# Patient Record
Sex: Female | Born: 2000 | Race: White | Hispanic: Yes | Marital: Single | Smoking: Never smoker
Health system: Southern US, Community
[De-identification: ages and names within clinical notes are randomized; demographics above are authoritative.]

## PROBLEM LIST (undated history)

## (undated) DIAGNOSIS — J45909 Unspecified asthma, uncomplicated: Secondary | ICD-10-CM

## (undated) DIAGNOSIS — F909 Attention-deficit hyperactivity disorder, unspecified type: Secondary | ICD-10-CM

## (undated) DIAGNOSIS — G43909 Migraine, unspecified, not intractable, without status migrainosus: Secondary | ICD-10-CM

## (undated) DIAGNOSIS — K5900 Constipation, unspecified: Secondary | ICD-10-CM

## (undated) DIAGNOSIS — N926 Irregular menstruation, unspecified: Principal | ICD-10-CM

## (undated) HISTORY — DX: Attention-deficit hyperactivity disorder, unspecified type: F90.9

## (undated) HISTORY — DX: Irregular menstruation, unspecified: N92.6

## (undated) HISTORY — DX: Unspecified asthma, uncomplicated: J45.909

## (undated) HISTORY — DX: Constipation, unspecified: K59.00

## (undated) HISTORY — PX: WISDOM TOOTH EXTRACTION: SHX21

## (undated) HISTORY — DX: Migraine, unspecified, not intractable, without status migrainosus: G43.909

---
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
## 2018-01-11 ENCOUNTER — Ambulatory Visit: Payer: BC Managed Care – PPO | Admitting: Adult Health

## 2018-02-13 ENCOUNTER — Telehealth: Payer: Self-pay | Admitting: Women's Health

## 2018-02-13 NOTE — Telephone Encounter (Signed)
Pt wanted to be worked in this week for bleeding and a rash from the panty liners she is having to wear all the time, I didn't have anything so she wanted to have someone call her and maybe advise something she can try or let us know if we need to work in somewhere this week thanks

## 2018-02-13 NOTE — Telephone Encounter (Signed)
Patient states she has a rash in her vaginal area due to having to wear panty liners for her bleeding.  She has used Kotex and hasn't had a problem in the past.  Advised patient to try switching brands and to also try using some hydrocortisone cream to the area.  Informed she may need something for yeast as well and could check at the pharmacy.  Verbalized understanding.

## 2018-03-03 ENCOUNTER — Encounter: Payer: Self-pay | Admitting: Family Medicine

## 2018-04-05 ENCOUNTER — Ambulatory Visit: Payer: BC Managed Care – PPO | Admitting: Women's Health

## 2018-04-05 ENCOUNTER — Ambulatory Visit (INDEPENDENT_AMBULATORY_CARE_PROVIDER_SITE_OTHER): Payer: BC Managed Care – PPO | Admitting: *Deleted

## 2018-04-05 ENCOUNTER — Encounter: Payer: Self-pay | Admitting: Women's Health

## 2018-04-05 ENCOUNTER — Encounter: Payer: Self-pay | Admitting: *Deleted

## 2018-04-05 VITALS — BP 109/61 | HR 84 | Ht 62.0 in | Wt 141.0 lb

## 2018-04-05 DIAGNOSIS — Z308 Encounter for other contraceptive management: Secondary | ICD-10-CM

## 2018-04-05 DIAGNOSIS — Z3201 Encounter for pregnancy test, result positive: Secondary | ICD-10-CM

## 2018-04-05 DIAGNOSIS — Z3202 Encounter for pregnancy test, result negative: Secondary | ICD-10-CM

## 2018-04-05 DIAGNOSIS — Z3042 Encounter for surveillance of injectable contraceptive: Secondary | ICD-10-CM | POA: Diagnosis not present

## 2018-04-05 DIAGNOSIS — Z3046 Encounter for surveillance of implantable subdermal contraceptive: Secondary | ICD-10-CM

## 2018-04-05 LAB — POCT URINE PREGNANCY: PREG TEST UR: NEGATIVE

## 2018-04-05 MED ORDER — MEDROXYPROGESTERONE ACETATE 150 MG/ML IM SUSP
150.0000 mg | Freq: Once | INTRAMUSCULAR | Status: AC
  Administered 2018-04-05: 150 mg via INTRAMUSCULAR

## 2018-04-05 MED ORDER — MEDROXYPROGESTERONE ACETATE 150 MG/ML IM SUSP: 150.0000 mg | INTRAMUSCULAR | 3 refills | Status: DC

## 2018-04-05 NOTE — Patient Instructions (Signed)

## 2018-04-05 NOTE — Progress Notes (Signed)
Pt here for Depo. Pt received Depo in left deltoid. Pt tolerated shot well. Return in 12 weeks for next shot. JSY 

## 2018-04-05 NOTE — Progress Notes (Signed)
   [facility] Patient name: Tonya Mclaughlin MRN [id number]  Date of birth: 11/22/2000 Subjective Findings:   Tonya Mclaughlin is a 17 y.o. G0P0000 Caucasian female being seen today for removal of a Nexplanon. Her Nexplanon was placed 11/29/17.  She desires removal because irregular/unpredictable bleeding. Signed copy of informed consent in chart.   No LMP recorded. Patient has had an implant. Last pap <21yo. Results were:  n/a The planned method of family planning is Depo-Provera injections Pertinent History Reviewed:   Reviewed past medical,surgical, social, obstetrical and family history.  Reviewed problem list, medications and allergies. Objective Findings & Procedure:    Vitals:   04/05/18 1120  BP: 109/61  Pulse: 84  Weight: 141 lb (64 kg)  Height: 5\' 2"  (1.575 m)  Body mass index is 25.79 kg/m.  No results found for this or any previous visit (from the past 24 hour(s)).   Time out was performed.  Nexplanon site identified.  Area prepped in usual sterile fashon. One cc of 2% lidocaine was used to anesthetize the area at the distal end of the implant. A small stab incision was made right beside the implant on the distal portion.  The Nexplanon rod was grasped using hemostats and removed without difficulty.  There was less than 3 cc blood loss. There were no complications.  Steri-strips were applied over the small incision and a pressure bandage was applied.  The patient tolerated the procedure well. Assessment & Plan:   1) Nexplanon removal She was instructed to keep the area clean and dry, remove pressure bandage in 24 hours, and keep insertion site covered with the steri-strip for 3-5 days.   Follow-up PRN problems.  No orders of the defined types were placed in this encounter.   Follow-up: Return for today for depo. condoms x 2wks  Cheral MarkerKimberly R Anush Wiedeman CNM, [facility]NP-BC 04/05/2018 12:11 PM

## 2018-06-28 ENCOUNTER — Encounter: Payer: Self-pay | Admitting: *Deleted

## 2018-06-28 ENCOUNTER — Ambulatory Visit (INDEPENDENT_AMBULATORY_CARE_PROVIDER_SITE_OTHER): Payer: BC Managed Care – PPO | Admitting: *Deleted

## 2018-06-28 DIAGNOSIS — Z3042 Encounter for surveillance of injectable contraceptive: Secondary | ICD-10-CM

## 2018-06-28 DIAGNOSIS — Z3202 Encounter for pregnancy test, result negative: Secondary | ICD-10-CM

## 2018-06-28 DIAGNOSIS — Z308 Encounter for other contraceptive management: Secondary | ICD-10-CM

## 2018-06-28 LAB — POCT URINE PREGNANCY: PREG TEST UR: NEGATIVE

## 2018-06-28 MED ORDER — MEDROXYPROGESTERONE ACETATE 150 MG/ML IM SUSP
150.0000 mg | Freq: Once | INTRAMUSCULAR | Status: AC
  Administered 2018-06-28: 150 mg via INTRAMUSCULAR

## 2018-06-28 NOTE — Progress Notes (Signed)
Pt here for Depo. Pt received shot in right deltoid. Pt tolerated shot well. Return in 12 weeks for next shot. JSY 

## 2018-09-21 ENCOUNTER — Encounter: Payer: Self-pay | Admitting: *Deleted

## 2018-09-21 ENCOUNTER — Encounter (INDEPENDENT_AMBULATORY_CARE_PROVIDER_SITE_OTHER): Payer: Self-pay

## 2018-09-21 ENCOUNTER — Ambulatory Visit (INDEPENDENT_AMBULATORY_CARE_PROVIDER_SITE_OTHER): Payer: BC Managed Care – PPO | Admitting: *Deleted

## 2018-09-21 VITALS — Wt 147.0 lb

## 2018-09-21 DIAGNOSIS — Z3042 Encounter for surveillance of injectable contraceptive: Secondary | ICD-10-CM

## 2018-09-21 DIAGNOSIS — Z3202 Encounter for pregnancy test, result negative: Secondary | ICD-10-CM | POA: Diagnosis not present

## 2018-09-21 LAB — POCT URINE PREGNANCY: PREG TEST UR: NEGATIVE

## 2018-09-21 MED ORDER — MEDROXYPROGESTERONE ACETATE 150 MG/ML IM SUSP
150.0000 mg | Freq: Once | INTRAMUSCULAR | Status: AC
  Administered 2018-09-21: 150 mg via INTRAMUSCULAR

## 2018-09-21 NOTE — Progress Notes (Signed)
Depo Provera 150mg IM given in left deltoid with no complications. Pt to return in 12 weeks for next injection.  

## 2018-12-14 ENCOUNTER — Encounter: Payer: Self-pay | Admitting: *Deleted

## 2018-12-14 ENCOUNTER — Other Ambulatory Visit: Payer: Self-pay

## 2018-12-14 ENCOUNTER — Ambulatory Visit (INDEPENDENT_AMBULATORY_CARE_PROVIDER_SITE_OTHER): Payer: BC Managed Care – PPO | Admitting: *Deleted

## 2018-12-14 DIAGNOSIS — Z3042 Encounter for surveillance of injectable contraceptive: Secondary | ICD-10-CM | POA: Diagnosis not present

## 2018-12-14 DIAGNOSIS — Z308 Encounter for other contraceptive management: Secondary | ICD-10-CM

## 2018-12-14 DIAGNOSIS — Z3202 Encounter for pregnancy test, result negative: Secondary | ICD-10-CM

## 2018-12-14 LAB — POCT URINE PREGNANCY: Preg Test, Ur: NEGATIVE

## 2018-12-14 MED ORDER — MEDROXYPROGESTERONE ACETATE 150 MG/ML IM SUSP
150.0000 mg | Freq: Once | INTRAMUSCULAR | Status: AC
  Administered 2018-12-14: 150 mg via INTRAMUSCULAR

## 2018-12-14 NOTE — Progress Notes (Signed)
Pt here for Depo. Pt received shot in right deltoid. Pt tolerated shot well. Return in 12 weeks for next shot. JSY 

## 2019-03-08 ENCOUNTER — Other Ambulatory Visit: Payer: Self-pay | Admitting: Women's Health

## 2019-03-08 ENCOUNTER — Ambulatory Visit: Payer: BC Managed Care – PPO

## 2019-03-09 ENCOUNTER — Other Ambulatory Visit: Payer: Self-pay

## 2019-03-09 ENCOUNTER — Ambulatory Visit (INDEPENDENT_AMBULATORY_CARE_PROVIDER_SITE_OTHER): Payer: BC Managed Care – PPO | Admitting: *Deleted

## 2019-03-09 DIAGNOSIS — Z3042 Encounter for surveillance of injectable contraceptive: Secondary | ICD-10-CM | POA: Diagnosis not present

## 2019-03-09 MED ORDER — MEDROXYPROGESTERONE ACETATE 150 MG/ML IM SUSP
150.0000 mg | Freq: Once | INTRAMUSCULAR | Status: AC
  Administered 2019-03-09: 150 mg via INTRAMUSCULAR

## 2019-03-09 NOTE — Progress Notes (Signed)
Pt in for depo provera injection. Given IM in left deltoid. Patient tolerated well. Next dose 8/28-9/11.

## 2019-03-14 ENCOUNTER — Encounter: Payer: Self-pay | Admitting: Gastroenterology

## 2019-03-27 ENCOUNTER — Telehealth: Payer: Self-pay | Admitting: Gastroenterology

## 2019-03-27 NOTE — Telephone Encounter (Signed)
Patient called and said that she could not come in for her Wednesday appointment because of work and wanted to come in Thursday, I told her that we would have to reschedule because we did not have anything this Thursday and she said that she waited a month to get this appointment so she will just go somewhere else.

## 2019-03-27 NOTE — Telephone Encounter (Signed)
Noted  

## 2019-03-28 ENCOUNTER — Ambulatory Visit: Payer: BC Managed Care – PPO | Admitting: Gastroenterology

## 2019-04-13 ENCOUNTER — Other Ambulatory Visit: Payer: Self-pay | Admitting: Gastroenterology

## 2019-04-13 DIAGNOSIS — R1011 Right upper quadrant pain: Secondary | ICD-10-CM

## 2019-04-24 ENCOUNTER — Ambulatory Visit
Admission: RE | Admit: 2019-04-24 | Discharge: 2019-04-24 | Disposition: A | Discharge status: Home / Self Care | Payer: BC Managed Care – PPO | Source: Ambulatory Visit | Attending: Gastroenterology | Admitting: Gastroenterology

## 2019-04-24 ENCOUNTER — Other Ambulatory Visit: Payer: Self-pay

## 2019-04-24 DIAGNOSIS — R1011 Right upper quadrant pain: Secondary | ICD-10-CM | POA: Insufficient documentation

## 2019-04-24 MED ORDER — TECHNETIUM TC 99M MEBROFENIN IV KIT
5.0000 | PACK | Freq: Once | INTRAVENOUS | Status: AC | PRN
  Administered 2019-04-24: 5.42 via INTRAVENOUS

## 2019-06-01 ENCOUNTER — Ambulatory Visit: Payer: BC Managed Care – PPO

## 2019-06-01 ENCOUNTER — Telehealth: Payer: Self-pay | Admitting: Obstetrics & Gynecology

## 2019-06-01 NOTE — Telephone Encounter (Signed)
Called patient regarding appointment and the following message was left: ° ° °We have you scheduled for an upcoming appointment at our office. At this time, patients are encouraged to come alone to their visits whenever possible, however, a support person, over age 20, may accompany you to your appointment if assistance is needed for safety or care concerns. Otherwise, support persons should remain outside until the visit is complete.  ° °We ask if you have had any exposure to anyone suspected or confirmed of having COVID-19 or if you are experiencing any of the following, to call and reschedule your appointment: fever, cough, shortness of breath, muscle pain, diarrhea, rash, vomiting, abdominal pain, red eye, weakness, bruising, bleeding, joint pain, or a severe headache.  ° °Please know we will ask you these questions or similar questions when you arrive for your appointment and again it’s how we are keeping everyone safe.   ° °Also,to keep you safe, please use the provided hand sanitizer when you enter the office. We are asking everyone in the office to wear a mask to help prevent the spread of °germs. If you have a mask of your own, please wear it to your appointment, if not, we are happy to provide one for you. ° °Thank you for understanding and your cooperation.  ° ° °[facility] Staff ° ° ° ° ° °

## 2019-06-05 ENCOUNTER — Ambulatory Visit: Payer: BC Managed Care – PPO

## 2019-06-05 ENCOUNTER — Other Ambulatory Visit: Payer: Self-pay | Admitting: Surgery

## 2019-06-05 ENCOUNTER — Telehealth: Payer: Self-pay | Admitting: *Deleted

## 2019-06-05 NOTE — Telephone Encounter (Signed)
Depo called into [facility] on [address]. Left message letting pt know and also that she needs an appt before any further refills. [name]

## 2019-06-06 ENCOUNTER — Telehealth: Payer: Self-pay | Admitting: Obstetrics & Gynecology

## 2019-06-06 NOTE — Telephone Encounter (Signed)
Called patient regarding appointment scheduled in our office encouraged to come alone to the visit if possible, however, a support person, over age 20, may accompany her  to appointment if assistance is needed for safety or care concerns. Otherwise, support persons should remain outside until the visit is complete.  ° °We ask if you have had any exposure to anyone suspected or confirmed of having COVID-19 or if you are experiencing any of the following, to call and reschedule your appointment: fever, cough, shortness of breath, muscle pain, diarrhea, rash, vomiting, abdominal pain, red eye, weakness, bruising, bleeding, joint pain, or a severe headache.  ° °Please know we will ask you these questions or similar questions when you arrive for your appointment and again it’s how we are keeping everyone safe.   ° °Also,to keep you safe, please use the provided hand sanitizer when you enter the office. We are asking everyone in the office to wear a mask to help prevent the spread of °germs. If you have a mask of your own, please wear it to your appointment, if not, we are happy to provide one for you. ° °Thank you for understanding and your cooperation.  ° ° °[facility] Staff ° ° ° °

## 2019-06-07 ENCOUNTER — Other Ambulatory Visit: Payer: Self-pay

## 2019-06-07 ENCOUNTER — Ambulatory Visit (INDEPENDENT_AMBULATORY_CARE_PROVIDER_SITE_OTHER): Payer: BC Managed Care – PPO

## 2019-06-07 VITALS — Ht 62.0 in | Wt 154.0 lb

## 2019-06-07 DIAGNOSIS — Z3042 Encounter for surveillance of injectable contraceptive: Secondary | ICD-10-CM | POA: Diagnosis not present

## 2019-06-07 DIAGNOSIS — Z3202 Encounter for pregnancy test, result negative: Secondary | ICD-10-CM

## 2019-06-07 LAB — POCT URINE PREGNANCY: Preg Test, Ur: NEGATIVE

## 2019-06-07 MED ORDER — MEDROXYPROGESTERONE ACETATE 150 MG/ML IM SUSP
150.0000 mg | Freq: Once | INTRAMUSCULAR | Status: AC
  Administered 2019-06-07: 12:00:00 150 mg via INTRAMUSCULAR

## 2019-06-07 NOTE — Progress Notes (Signed)
Pt here for depo injection 150 mg IM given rt deltoid. Tolerated well. Return 12 weeks for next injection. Pad CMA 

## 2019-08-30 ENCOUNTER — Ambulatory Visit: Payer: BC Managed Care – PPO

## 2019-08-31 ENCOUNTER — Other Ambulatory Visit: Payer: Self-pay | Admitting: Women's Health

## 2019-08-31 ENCOUNTER — Telehealth: Payer: Self-pay | Admitting: Obstetrics & Gynecology

## 2019-08-31 NOTE — Telephone Encounter (Signed)

## 2019-09-03 ENCOUNTER — Other Ambulatory Visit: Payer: Self-pay

## 2019-09-03 ENCOUNTER — Ambulatory Visit: Payer: BC Managed Care – PPO

## 2019-09-03 ENCOUNTER — Ambulatory Visit (INDEPENDENT_AMBULATORY_CARE_PROVIDER_SITE_OTHER): Payer: BC Managed Care – PPO | Admitting: *Deleted

## 2019-09-03 DIAGNOSIS — Z3042 Encounter for surveillance of injectable contraceptive: Secondary | ICD-10-CM | POA: Diagnosis not present

## 2019-09-03 MED ORDER — MEDROXYPROGESTERONE ACETATE 150 MG/ML IM SUSP
150.0000 mg | Freq: Once | INTRAMUSCULAR | Status: AC
  Administered 2019-09-03: 150 mg via INTRAMUSCULAR

## 2019-11-22 ENCOUNTER — Telehealth: Payer: Self-pay | Admitting: *Deleted

## 2019-11-22 MED ORDER — MEDROXYPROGESTERONE ACETATE 150 MG/ML IM SUSP: INTRAMUSCULAR | 0 refills | Status: DC

## 2019-11-22 NOTE — Telephone Encounter (Signed)
Pt needs refill on depo before Monday.

## 2019-11-22 NOTE — Telephone Encounter (Signed)
Refilled depo x 1 placed comment to make appt for physical

## 2019-11-26 ENCOUNTER — Encounter: Payer: Self-pay | Admitting: *Deleted

## 2019-11-26 ENCOUNTER — Ambulatory Visit (INDEPENDENT_AMBULATORY_CARE_PROVIDER_SITE_OTHER): Payer: BC Managed Care – PPO | Admitting: *Deleted

## 2019-11-26 ENCOUNTER — Other Ambulatory Visit: Payer: Self-pay

## 2019-11-26 DIAGNOSIS — Z3042 Encounter for surveillance of injectable contraceptive: Secondary | ICD-10-CM | POA: Diagnosis not present

## 2019-11-26 MED ORDER — MEDROXYPROGESTERONE ACETATE 150 MG/ML IM SUSP
150.0000 mg | Freq: Once | INTRAMUSCULAR | Status: AC
  Administered 2019-11-26: 10:00:00 150 mg via INTRAMUSCULAR

## 2019-11-26 NOTE — Progress Notes (Signed)
   NURSE VISIT- INJECTION  SUBJECTIVE:  Tonya Mclaughlin is a 19 y.o. G0P0000 female here for a Depo Provera for contraception/period management. She is a GYN patient.   OBJECTIVE:  There were no vitals taken for this visit.  Appears well, in no apparent distress  Injection administered in: Right deltoid  Meds ordered this encounter  Medications  . medroxyPROGESTERone (DEPO-PROVERA) injection 150 mg    ASSESSMENT: GYN patient Depo Provera for contraception/period management PLAN: Follow-up: in 11-13 weeks for next Depo   Jobe Marker  11/26/2019 11:32 AM

## 2019-12-28 ENCOUNTER — Ambulatory Visit: Payer: BC Managed Care – PPO | Attending: Internal Medicine

## 2019-12-28 DIAGNOSIS — Z23 Encounter for immunization: Secondary | ICD-10-CM

## 2019-12-28 NOTE — Progress Notes (Signed)
   Covid-19 Vaccination Clinic  Name:  Tonya Mclaughlin    MRN: [id number] DOB: October 18, 2000  12/28/2019  [name] was observed post Covid-19 immunization for 15 minutes without incident. She was provided with Vaccine Information Sheet and instruction to access the V-Safe system.   [name] was instructed to call 911 with any severe reactions post vaccine: Marland Kitchen Difficulty breathing  . Swelling of face and throat  . A fast heartbeat  . A bad rash all over body  . Dizziness and weakness   Immunizations Administered    Name Date Dose VIS Date Route   Moderna COVID-19 Vaccine 12/28/2019  8:17 AM 0.5 mL 08/28/2019 Intramuscular   Manufacturer: Moderna   Lot: [id number]   NDC: 71959-747-18

## 2020-01-30 ENCOUNTER — Ambulatory Visit: Payer: BC Managed Care – PPO | Attending: Internal Medicine

## 2020-01-30 DIAGNOSIS — Z23 Encounter for immunization: Secondary | ICD-10-CM

## 2020-01-30 NOTE — Progress Notes (Signed)
   Covid-19 Vaccination Clinic  Name:  Tonya Mclaughlin    MRN: [id number] DOB: 2001/01/11  01/30/2020  [name] was observed post Covid-19 immunization for 15 minutes without incident. She was provided with Vaccine Information Sheet and instruction to access the V-Safe system.   [name] was instructed to call 911 with any severe reactions post vaccine: Marland Kitchen Difficulty breathing  . Swelling of face and throat  . A fast heartbeat  . A bad rash all over body  . Dizziness and weakness   Immunizations Administered    Name Date Dose VIS Date Route   Moderna COVID-19 Vaccine 01/30/2020 12:23 PM 0.5 mL 08/2019 Intramuscular   Manufacturer: Moderna   Lot: [id number]   NDC: 88757-972-82

## 2020-02-11 ENCOUNTER — Ambulatory Visit: Payer: BC Managed Care – PPO

## 2020-02-11 ENCOUNTER — Encounter: Payer: Self-pay | Admitting: *Deleted

## 2020-02-11 ENCOUNTER — Other Ambulatory Visit: Payer: Self-pay

## 2020-02-11 ENCOUNTER — Other Ambulatory Visit: Payer: Self-pay | Admitting: Adult Health

## 2020-02-12 ENCOUNTER — Encounter: Payer: Self-pay | Admitting: Adult Health

## 2020-02-12 ENCOUNTER — Telehealth (INDEPENDENT_AMBULATORY_CARE_PROVIDER_SITE_OTHER): Payer: BC Managed Care – PPO | Admitting: Adult Health

## 2020-02-12 DIAGNOSIS — Z3042 Encounter for surveillance of injectable contraceptive: Secondary | ICD-10-CM | POA: Diagnosis not present

## 2020-02-12 MED ORDER — MEDROXYPROGESTERONE ACETATE 150 MG/ML IM SUSP: INTRAMUSCULAR | 4 refills | Status: DC

## 2020-02-12 NOTE — Progress Notes (Signed)
Patient ID: Tonya Mclaughlin, female   DOB: Feb 13, 2001, 19 y.o.   MRN: [id number]   TELEHEALTH VIRTUAL GYNECOLOGY VISIT ENCOUNTER NOTE  I connected with Tonya Mclaughlin on 02/12/20 at 11:10 AM EDT by telephone at work and verified that I am speaking with the correct person using two identifiers.   I discussed the limitations, risks, security and privacy concerns of performing an evaluation and management service by telephone and the availability of in person appointments. I also discussed with the patient that there may be a patient responsible charge related to this service. The patient expressed understanding and agreed to proceed.   History:  Tonya Mclaughlin is a 19 y.o. G0P0000 female being evaluated today for refill on depo provera. She denies any headaches,bleeding, pelvic pain,problems with sex, or other concerns.    Happy with depo. PCP is Dr Margo Aye.   Past Medical History:  Diagnosis Date  . ADHD (attention deficit hyperactivity disorder)   . Asthma   . Constipation   . Irregular bleeding 02/13/2015  . Migraine    History reviewed. No pertinent surgical history. The following portions of the patient's history were reviewed and updated as appropriate: allergies, current medications, past family history, past medical history, past social history, past surgical history and problem list.   Health Maintenance:  Pap due at 19 She declines need for STD testing.  Review of Systems:  Pertinent items noted in HPI and remainder of comprehensive ROS otherwise negative.  Physical Exam:   General:  Alert, oriented and cooperative.   Mental Status: Normal mood and affect perceived. Normal judgment and thought content.  Physical exam deferred due to nature of the encounter  Labs and Imaging No results found for this or any previous visit (from the past 336 hour(s)). No results found.    Assessment and Plan:      1. Encounter for surveillance of injectable contraceptive refilled depo Meds ordered this  encounter  Medications  . medroxyPROGESTERone (DEPO-PROVERA) 150 MG/ML injection    Sig: INJECT 1 ML INTO THE MUSCLE EVERY 3 MONTHS.    Dispense:  1 mL    Refill:  4    Order Specific Question:   Supervising Provider    Answer:   Lazaro Arms [[id number]]     She says she is getting depo at PCP Thursday, cancelled appt here.  Pap at 19  I discussed the assessment and treatment plan with the patient. The patient was provided an opportunity to ask questions and all were answered. The patient agreed with the plan and demonstrated an understanding of the instructions.   The patient was advised to call back or seek an in-person evaluation/go to the ED if the symptoms worsen or if the condition fails to improve as anticipated.  I provided 5 minutes of non-face-to-face time during this encounter.   Cyril Mourning, NP [facility] [employer], [facility]

## 2020-02-14 ENCOUNTER — Ambulatory Visit: Payer: BC Managed Care – PPO

## 2020-04-16 ENCOUNTER — Other Ambulatory Visit: Payer: Self-pay

## 2020-04-16 ENCOUNTER — Encounter (INDEPENDENT_AMBULATORY_CARE_PROVIDER_SITE_OTHER): Payer: Self-pay | Admitting: Nurse Practitioner

## 2020-04-16 ENCOUNTER — Ambulatory Visit (INDEPENDENT_AMBULATORY_CARE_PROVIDER_SITE_OTHER): Payer: BC Managed Care – PPO | Admitting: Nurse Practitioner

## 2020-04-16 VITALS — BP 135/90 | HR 97 | Temp 98.0°F | Ht 62.0 in | Wt 142.6 lb

## 2020-04-16 DIAGNOSIS — R5383 Other fatigue: Secondary | ICD-10-CM | POA: Diagnosis not present

## 2020-04-16 DIAGNOSIS — L659 Nonscarring hair loss, unspecified: Secondary | ICD-10-CM

## 2020-04-16 DIAGNOSIS — R591 Generalized enlarged lymph nodes: Secondary | ICD-10-CM

## 2020-04-16 DIAGNOSIS — R634 Abnormal weight loss: Secondary | ICD-10-CM

## 2020-04-16 DIAGNOSIS — Z3200 Encounter for pregnancy test, result unknown: Secondary | ICD-10-CM

## 2020-04-16 DIAGNOSIS — Z13220 Encounter for screening for lipoid disorders: Secondary | ICD-10-CM

## 2020-04-16 DIAGNOSIS — Z131 Encounter for screening for diabetes mellitus: Secondary | ICD-10-CM

## 2020-04-16 DIAGNOSIS — L709 Acne, unspecified: Secondary | ICD-10-CM | POA: Diagnosis not present

## 2020-04-16 NOTE — Progress Notes (Signed)
Subjective:  Patient ID: Tonya Mclaughlin, female    DOB: December 02, 2000  Age: 19 y.o. MRN: [id number]  CC:  Chief Complaint  Patient presents with  . Establish Care  . knot on her left ear      HPI  This patient arrives today to establish care at this office.  She has been a patient of Dr. Josue Hector, but has heard good things about this office and is interested in a more holistic approach, so she is establishing care here and will consider transitioning here as her primary care provider.  She has multiple acute complaints today.  Her main concern is that she has a bump in front of her ear that was present in the past 1 away but then has returned.  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  She tells me she still experiences tinnitus, but it is pretty stable.  She has no internal ear pain or drainage from her ear.  He bumped on her ear can be a bit tender on palpation but does not seem to be causing her too much discomfort.  She tells me she was evaluated by a provider at her other doctor's office and they did note some fluid behind her eardrum and they recommended that she take an antihistamine.  She is concerned about possible recurrence of fluid behind her ear and/or ear infection as she is getting ready to go away to the beach next week and she will be swimming at that time.  Other complaints include fatigue throughout the day, she is concerned about possible having vitamin D deficiency, hormonal imbalance, hair loss, and acne.  She tells me she is on the Depo-Provera shot for contraception.  Prior to having this shot initiated she was having regular but heavy menstruation.  She tells me her last menstrual cycle was approximately 2 months ago, but this is not unusual for her as she has skipped menstrual cycles which she feels is a side effect from the Depo-Provera shot.  She is also mention to me that she was put on Vyvanse for what sounds like  fatigue, and this has resulted in poor appetite.  She is also been losing weight without intention.  And this is concerning to her.   Past Medical History:  Diagnosis Date  . ADHD (attention deficit hyperactivity disorder)   . Asthma   . Constipation   . Irregular bleeding 02/13/2015  . Migraine       Family History  Problem Relation Age of Onset  . Hypertension Mother   . 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 Smoking status: Never Smoker  . Smokeless tobacco: Never Used  Substance Use Topics  . Alcohol use: No     Current Meds  Medication Sig  . lisdexamfetamine (VYVANSE) 30 MG capsule Take 30 mg by mouth daily.  . medroxyPROGESTERone (DEPO-PROVERA) 150 MG/ML injection INJECT 1 ML INTO THE MUSCLE EVERY 3 MONTHS.    ROS:  See HPI   Objective:   Today's Vitals: BP 135/90 (BP Location: Left Arm, Patient Position: Sitting, Cuff Size: Normal)   Pulse 97   Temp 98 F (36.7 C) (Temporal)   Ht  5' 2"  (1.575 m)   Wt 142 lb 9.6 oz (64.7 kg)   LMP 02/11/2020 (Approximate)   SpO2 99%   BMI 26.08 kg/m  Vitals with BMI 04/16/2020 06/07/2019 09/21/2018  Height 5' 2"  5' 2"  -  Weight 142 lbs 10 oz 154 lbs 147 lbs  BMI 63.84 66.59 -  Systolic 935 - -  Diastolic 90 - -  Pulse 97 - -     Physical Exam Vitals reviewed.  Constitutional:      General: She is not in acute distress.    Appearance: Normal appearance.  HENT:     Head: Normocephalic and atraumatic.     Right Ear: Hearing, tympanic membrane, ear canal and external ear normal.     Left Ear: Hearing and tympanic membrane normal.     Ears:      Comments: Mild erythema to left ear canal but no edema noted Neck:     Vascular: No carotid bruit.  Cardiovascular:     Rate and Rhythm: Normal rate and regular rhythm.     Pulses: Normal pulses.      Heart sounds: Normal heart sounds.  Pulmonary:     Effort: Pulmonary effort is normal.     Breath sounds: Normal breath sounds.  Skin:    General: Skin is warm and dry.  Neurological:     General: No focal deficit present.     Mental Status: She is alert and oriented to person, place, and time.  Psychiatric:        Mood and Affect: Mood normal.        Behavior: Behavior normal.        Judgment: Judgment normal.          Assessment and Plan   1. Fatigue, unspecified type   2. Acne, unspecified acne type   3. Weight loss   4. Hair loss   5. Screening, lipid   6. Screening for diabetes mellitus   7. Encounter for pregnancy test, result unknown   8. Lymphadenopathy of head and neck      Plan: I will start by ordering blood work for further evaluation of her concerns.  As far as the mass noted near her left ear I think this represents a lymph node.  It is fairly small, soft, and movable.  For now I recommended we monitor her and if lymph node is still enlarged on subsequent follow-up will consider imaging for further evaluation.  Blood work ordered as listed below.  I recommended that she try Flonase nasal spray over-the-counter for prevention of serious effusion of the ear.  She tells me she will consider this while she is on vacation.  I did also tell her that I agree use an antihistamine may help prevent fluid behind the eardrum.  She tells me she understands.   Tests ordered Orders Placed This Encounter  Procedures  . CBC with Differential/Platelets  . CMP with eGFR([facility])  . Lipid Panel  . Hemoglobin A1c  . Follicle Stimulating Hormone  . Luteinizing Hormone  . TSH  . T3, Free  . T4, Free  . Vitamin D, 25-hydroxy  . Pregnancy, urine  . Urinalysis with Reflex Microscopic      No orders of the defined types were placed in this encounter.   Patient to follow-up in approximately 1 month  Ailene Ards, NP

## 2020-04-16 NOTE — Patient Instructions (Signed)
[address] [employer])

## 2020-04-25 LAB — COMPLETE METABOLIC PANEL WITH GFR
AG Ratio: 2 (calc) (ref 1.0–2.5)
ALT: 20 U/L (ref 6–29)
AST: 17 U/L (ref 10–30)
Albumin: 4.9 g/dL (ref 3.6–5.1)
Alkaline phosphatase (APISO): 46 U/L (ref 31–125)
BUN: 17 mg/dL (ref 7–25)
CO2: 26 mmol/L (ref 20–32)
Calcium: 9.7 mg/dL (ref 8.6–10.2)
Chloride: 106 mmol/L (ref 98–110)
Creat: 0.79 mg/dL (ref 0.50–1.10)
GFR, Est African American: 125 mL/min/{1.73_m2} (ref >=60)
GFR, Est Non African American: 108 mL/min/{1.73_m2} (ref >=60)
Globulin: 2.5 g/dL (calc) (ref 1.9–3.7)
Glucose, Bld: 81 mg/dL (ref 65–99)
Potassium: 4.7 mmol/L (ref 3.5–5.3)
Sodium: 139 mmol/L (ref 135–146)
Total Bilirubin: 0.9 mg/dL (ref 0.2–1.2)
Total Protein: 7.4 g/dL (ref 6.1–8.1)

## 2020-04-25 LAB — CBC WITH DIFFERENTIAL/PLATELET
Absolute Monocytes: 333 cells/uL (ref 200–950)
Basophils Absolute: 20 cells/uL (ref 0–200)
Basophils Relative: 0.4 %
Eosinophils Absolute: 59 cells/uL (ref 15–500)
Eosinophils Relative: 1.2 %
HCT: 41.5 % (ref 35.0–45.0)
Hemoglobin: 13.9 g/dL (ref 11.7–15.5)
Lymphs Abs: 1553 cells/uL (ref 850–3900)
MCH: 31.6 pg (ref 27.0–33.0)
MCHC: 33.5 g/dL (ref 32.0–36.0)
MCV: 94.3 fL (ref 80.0–100.0)
MPV: 11.2 fL (ref 7.5–12.5)
Monocytes Relative: 6.8 %
Neutro Abs: 2935 cells/uL (ref 1500–7800)
Neutrophils Relative %: 59.9 %
Platelets: 275 10*3/uL (ref 140–400)
RBC: 4.4 10*6/uL (ref 3.80–5.10)
RDW: 12.1 % (ref 11.0–15.0)
Total Lymphocyte: 31.7 %
WBC: 4.9 10*3/uL (ref 3.8–10.8)

## 2020-04-25 LAB — URINALYSIS, ROUTINE W REFLEX MICROSCOPIC
Bacteria, UA: NONE SEEN /HPF
Bilirubin Urine: NEGATIVE
Glucose, UA: NEGATIVE
Hyaline Cast: NONE SEEN /LPF
Ketones, ur: NEGATIVE
Leukocytes,Ua: NEGATIVE
Nitrite: NEGATIVE
Protein, ur: NEGATIVE
RBC / HPF: NONE SEEN /HPF (ref 0–2)
Specific Gravity, Urine: 1.011 (ref 1.001–1.03)
pH: 6 (ref 5.0–8.0)

## 2020-04-25 LAB — T3, FREE: T3, Free: 3.5 pg/mL (ref 3.0–4.7)

## 2020-04-25 LAB — VITAMIN D 25 HYDROXY (VIT D DEFICIENCY, FRACTURES): Vit D, 25-Hydroxy: 29 ng/mL — ABNORMAL LOW (ref 30–100)

## 2020-04-25 LAB — LIPID PANEL
Cholesterol: 113 mg/dL (ref <200)
HDL: 56 mg/dL (ref >=50)
LDL Cholesterol (Calc): 43 mg/dL (calc)
Non-HDL Cholesterol (Calc): 57 mg/dL (calc) (ref <130)
Total CHOL/HDL Ratio: 2 (calc) (ref <5.0)
Triglycerides: 48 mg/dL (ref <150)

## 2020-04-25 LAB — LUTEINIZING HORMONE: LH: 3.3 m[IU]/mL

## 2020-04-25 LAB — HEMOGLOBIN A1C
Hgb A1c MFr Bld: 4.9 % of total Hgb (ref <5.7)
Mean Plasma Glucose: 94 (calc)
eAG (mmol/L): 5.2 (calc)

## 2020-04-25 LAB — TSH: TSH: 1.82 mIU/L

## 2020-04-25 LAB — PREGNANCY, URINE: Preg Test, Ur: NEGATIVE

## 2020-04-25 LAB — FOLLICLE STIMULATING HORMONE: FSH: 5.3 m[IU]/mL

## 2020-04-25 LAB — T4, FREE: Free T4: 1.4 ng/dL (ref 0.8–1.4)

## 2020-05-05 ENCOUNTER — Ambulatory Visit
Admission: EM | Admit: 2020-05-05 | Discharge: 2020-05-05 | Disposition: A | Discharge status: Home / Self Care | Payer: BC Managed Care – PPO | Attending: Emergency Medicine | Admitting: Emergency Medicine

## 2020-05-05 ENCOUNTER — Other Ambulatory Visit: Payer: Self-pay

## 2020-05-05 DIAGNOSIS — Z1152 Encounter for screening for COVID-19: Secondary | ICD-10-CM | POA: Diagnosis not present

## 2020-05-05 DIAGNOSIS — J069 Acute upper respiratory infection, unspecified: Secondary | ICD-10-CM | POA: Insufficient documentation

## 2020-05-05 LAB — POCT RAPID STREP A (OFFICE): Rapid Strep A Screen: NEGATIVE

## 2020-05-05 MED ORDER — PREDNISONE 10 MG PO TABS: 20.0000 mg | ORAL_TABLET | Freq: Every day | ORAL | 0 refills | Status: AC

## 2020-05-05 NOTE — ED Provider Notes (Signed)
[facility]   [id number] 05/05/20 Arrival Time: 1726   CC: COVID symptoms  SUBJECTIVE: History from: patient.  Tonya Mclaughlin is a 19 y.o. female who presents to the urgent care for complaint of nasal congestion, and sore throat that started the past few days.  Denies sick exposure to COVID, flu or strep.  Denies recent travel.  Denies aggravating or alleviating symptoms.  Denies previous COVID infection.   Denies fever, chills, fatigue, nasal congestion, rhinorrhea, sore throat, cough, SOB, wheezing, chest pain, nausea, vomiting, changes in bowel or bladder habits.    ROS: As per HPI.  All other pertinent ROS negative.     Past Medical History:  Diagnosis Date  . ADHD (attention deficit hyperactivity disorder)   . Asthma   . Constipation   . Irregular bleeding 02/13/2015  . Migraine    History reviewed. No pertinent surgical history. Allergies  Allergen Reactions  . Penicillins     Doesn't work for her   No current facility-administered medications on file prior to encounter.   Current Outpatient Medications on File Prior to Encounter  Medication Sig Dispense Refill  . lisdexamfetamine (VYVANSE) 30 MG capsule Take 30 mg by mouth daily.    . medroxyPROGESTERone (DEPO-PROVERA) 150 MG/ML injection INJECT 1 ML INTO THE MUSCLE EVERY 3 MONTHS. 1 mL 4   Social History   Socioeconomic History  . Marital status: Single    Spouse name: Not on file  . Number of children: Not on file  . Years of education: 88  . Highest education level: Not on file  Occupational History  . Occupation: Consulting civil engineer  Tobacco Use  . Smoking status: Never Smoker  . Smokeless tobacco: Never Used  Vaping Use  . Vaping Use: Never used  Substance and Sexual Activity  . Alcohol use: No  . Drug use: No  . Sexual activity: Yes    Birth control/protection: Injection  Other Topics Concern  . 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 Difficulty of Paying Living Expenses:   Food Insecurity:   . Worried About Programme researcher, broadcasting/film/video in the Last Year:   . Barista in the Last Year:   Transportation Needs:   . Freight forwarder (Medical):   Marland Kitchen Lack of Transportation (Non-Medical):   Physical Activity:   . Days of Exercise per Week:   . Minutes of Exercise per Session:   Stress:   . Feeling of Stress :   Social Connections:   . Frequency of Communication with Friends and Family:   . Frequency of Social Gatherings with Friends and Family:   . Attends Religious Services:   . Active Member of Clubs or Organizations:   . Attends Banker Meetings:   Marland Kitchen Marital Status:   Intimate Partner Violence:   . Fear of Current or Ex-Partner:   . Emotionally Abused:   Marland Kitchen Physically Abused:   . Sexually Abused:    Family History  Problem Relation Age of Onset  . Hypertension Mother   . Migraines Mother     OBJECTIVE:  Vitals:   05/05/20 1826  BP: (!) 138/98  Pulse: 96  Resp: 20  Temp: 99.4 F (37.4 C)  SpO2: 98%     General appearance: alert;  appears fatigued, but nontoxic; speaking in full sentences and tolerating own secretions HEENT: NCAT; Ears: EACs clear, TMs pearly gray; Eyes: PERRL.  EOM grossly intact. Sinuses: nontender; Nose: nares patent without rhinorrhea, Throat: oropharynx clear, tonsils non erythematous or enlarged, uvula midline  Neck: supple without LAD Lungs: unlabored respirations, symmetrical air entry; cough: mild; no respiratory distress; CTAB Heart: regular rate and rhythm.  Radial pulses 2+ symmetrical bilaterally Skin: warm and dry Psychological: alert and cooperative; normal mood and affect  LABS:  Results for orders placed or performed during the hospital encounter of 05/05/20 (from the past 24 hour(s))  POCT rapid strep A     Status: None   Collection Time:  05/05/20  6:39 PM  Result Value Ref Range   Rapid Strep A Screen Negative Negative     ASSESSMENT & PLAN:  1. Viral URI   2. Encounter for screening for COVID-19     Meds ordered this encounter  Medications  . predniSONE (DELTASONE) 10 MG tablet    Sig: Take 2 tablets (20 mg total) by mouth daily for 5 days.    Dispense:  10 tablet    Refill:  0    Discharge Instructions POCT strep test was negative/sample will be sent for culture and someone will call if your result is abnormal COVID testing ordered.  It will take between 2-7 days for test results.  Someone will contact you regarding abnormal results.    In the meantime: You should remain isolated in your home for 10 days from symptom onset AND greater than 72 hours after symptoms resolution (absence of fever without the use of fever-reducing medication and improvement in respiratory symptoms), whichever is longer Get plenty of rest and push fluids Prescribe low-dose prednisone Use OTC zyrtec for nasal congestion, runny nose, and/or sore throat Use OTC flonase for nasal congestion and runny nose Halls lozenges to soothe throat Use medications daily for symptom relief Use OTC medications like ibuprofen or tylenol as needed fever or pain Call or go to the ED if you have any new or worsening symptoms such as fever, worsening cough, shortness of breath, chest tightness, chest pain, turning blue, changes in mental status, etc...   Reviewed expectations re: course of current medical issues. Questions answered. Outlined signs and symptoms indicating need for more acute intervention. Patient verbalized understanding. After Visit Summary given.      Note: This document was prepared using Dragon voice recognition software and may include unintentional dictation errors.     Durward Parcel, FNP 05/05/20 1901

## 2020-05-05 NOTE — Discharge Instructions (Addendum)
POCT strep test was negative/sample will be sent for culture and someone will call if your result is abnormal  COVID testing ordered.  It will take between 2-7 days for test results.  Someone will contact you regarding abnormal results.    In the meantime: You should remain isolated in your home for 10 days from symptom onset AND greater than 72 hours after symptoms resolution (absence of fever without the use of fever-reducing medication and improvement in respiratory symptoms), whichever is longer Get plenty of rest and push fluids Prescribe low-dose prednisone Use OTC zyrtec for nasal congestion, runny nose, and/or sore throat Use OTC flonase for nasal congestion and runny nose Halls lozenges to soothe throat Use medications daily for symptom relief Use OTC medications like ibuprofen or tylenol as needed fever or pain Call or go to the ED if you have any new or worsening symptoms such as fever, worsening cough, shortness of breath, chest tightness, chest pain, turning blue, changes in mental status, etc..Marland Kitchen

## 2020-05-05 NOTE — ED Triage Notes (Signed)
Pt presents with nasal congestion and sore throat that began over the weekend

## 2020-05-06 LAB — NOVEL CORONAVIRUS, NAA: SARS-CoV-2, NAA: NOT DETECTED

## 2020-05-06 LAB — SARS-COV-2, NAA 2 DAY TAT

## 2020-05-08 LAB — CULTURE, GROUP A STREP (THRC)

## 2020-05-15 ENCOUNTER — Other Ambulatory Visit: Payer: Self-pay

## 2020-05-15 ENCOUNTER — Encounter (INDEPENDENT_AMBULATORY_CARE_PROVIDER_SITE_OTHER): Payer: Self-pay | Admitting: Nurse Practitioner

## 2020-05-15 ENCOUNTER — Ambulatory Visit (INDEPENDENT_AMBULATORY_CARE_PROVIDER_SITE_OTHER): Payer: BC Managed Care – PPO | Admitting: Nurse Practitioner

## 2020-05-15 VITALS — BP 128/76 | HR 95 | Temp 97.4°F | Resp 18 | Ht 62.0 in | Wt 145.8 lb

## 2020-05-15 DIAGNOSIS — R5383 Other fatigue: Secondary | ICD-10-CM | POA: Diagnosis not present

## 2020-05-15 DIAGNOSIS — R140 Abdominal distension (gaseous): Secondary | ICD-10-CM | POA: Diagnosis not present

## 2020-05-15 MED ORDER — THYROID 15 MG PO TABS: 15.0000 mg | ORAL_TABLET | Freq: Every day | ORAL | 1 refills | Status: DC

## 2020-05-15 NOTE — Progress Notes (Deleted)
     Subjective:  Patient ID: Tonya Mclaughlin, female    DOB: December 17, 2000  Age: 19 y.o. MRN: [id number]  CC:  Chief Complaint  Patient presents with  . Follow-up      HPI  HPI  Past Medical History:  Diagnosis Date  . ADHD (attention deficit hyperactivity disorder)   . Asthma   . Constipation   . Irregular bleeding 02/13/2015  . Migraine       Family History  Problem Relation Age of Onset  . Hypertension Mother   . 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 Smoking status: Never Smoker  . Smokeless tobacco: Never Used  Substance Use Topics  . Alcohol use: No     Current Meds  Medication Sig  . lisdexamfetamine (VYVANSE) 30 MG capsule Take 30 mg by mouth daily.    ROS:  ROS   Objective:   Today's Vitals: BP 128/76   Pulse 95   Temp (!) 97.4 F (36.3 C) (Temporal)   Resp 18   Ht 5\' 2"  (1.575 m)   Wt 145 lb 12.8 oz (66.1 kg)   SpO2 98%   BMI 26.67 kg/m  Vitals with BMI 05/15/2020 05/05/2020 04/16/2020  Height 5\' 2"  - 5\' 2"   Weight 145 lbs 13 oz - 142 lbs 10 oz  BMI 26.66 - 26.08  Systolic 128 138 04/18/2020  Diastolic 76 98 90  Pulse 95 96 97     Physical Exam       Assessment and Plan   No diagnosis found.   Plan:    Tests ordered No orders of the defined types were placed in this encounter.     No orders of the defined types were placed in this encounter.   Patient to follow-up in ***  , NP

## 2020-05-15 NOTE — Progress Notes (Addendum)
Subjective:  Patient ID: Tonya Mclaughlin, female    DOB: October 23, 2000  Age: 19 y.o. MRN: [id number]  CC:  Chief Complaint  Patient presents with  . Follow-up  . Fatigue      HPI  This patient arrives today to follow-up. At last office visit she was establishing care his practice and had multiple concerns including fatigue, hair loss, and weight loss. Labs were collected including CBC, CMP, lipid panel, A1c, vitamin D level, thyroid panel, FSH, LH and all labs were normal except vitamin D was slightly low at 29. She was told to start a vitamin D3 supplement, and she tells me that she has not been consistent with taking this because she was recovering from a sinus infection over the last few weeks. She decided to stop her Vyvanse which she was taking for ADHD, and noticed that her appetite has normalized and she is also noticed that her hair loss is slowed. She still continues to feel fatigued and describes what sounds like difficulty with motivation as far as starting completing tasks. She is also thinking about stopping her Depo shot which she takes for contraception. She also has a history of asthma, depression, anxiety, and ADHD. 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 She also has concerns today regarding bloating. She has been evaluated by gastroenterology and has gone through multiple diagnostic studies. Eventually she tells me she was told she may have IBS or colitis, but was given the vague prognostic evaluation, and is searching for more answers. She tells me that she does notice her abdominal pain and bloating gets worse with certain foods including bread and even water. She is wondering if she may have some unknown sensitivities to certain foods. She tells me she has been tested for celiac and was found to be negative for this.  Past Medical History:  Diagnosis Date  . ADHD (attention deficit hyperactivity disorder)   . Asthma   . Constipation    . Irregular bleeding 02/13/2015  . Migraine       Family History  Problem Relation Age of Onset  . Hypertension Mother   . 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 Smoking status: Never Smoker  . Smokeless tobacco: Never Used  Substance Use Topics  . Alcohol use: No     Current Meds  Medication Sig  . Albuterol Sulfate (PROAIR RESPICLICK) 108 (90 Base) MCG/ACT AEPB Inhale 1-2 puffs into the lungs every 6 (six) hours as needed.  . [DISCONTINUED] lisdexamfetamine (VYVANSE) 30 MG capsule Take 30 mg by mouth daily.    ROS:  Review of Systems  Constitutional: Positive for malaise/fatigue.  Respiratory: Negative.   Cardiovascular: Negative.   Neurological: Negative.      Objective:   Today's Vitals: BP 128/76   Pulse 95   Temp (!) 97.4 F (36.3 C) (Temporal)   Resp 18   Ht 5\' 2"  (1.575 m)   Wt 145 lb 12.8 oz (66.1 kg)   SpO2 98%   BMI 26.67 kg/m  Vitals with BMI 05/15/2020 05/05/2020 04/16/2020  Height 5\' 2"  - 5\' 2"   Weight 145 lbs 13 oz - 142 lbs 10 oz  BMI 26.66 - 26.08  Systolic 128  138 135  Diastolic 76 98 90  Pulse 95 96 97     Physical Exam Vitals reviewed.  Constitutional:      General: She is not in acute distress.    Appearance: Normal appearance.  HENT:     Head: Normocephalic and atraumatic.  Neck:     Vascular: No carotid bruit.  Cardiovascular:     Rate and Rhythm: Normal rate and regular rhythm.     Pulses: Normal pulses.     Heart sounds: Normal heart sounds.  Pulmonary:     Effort: Pulmonary effort is normal.     Breath sounds: Normal breath sounds.  Skin:    General: Skin is warm and dry.  Neurological:     General: No focal deficit present.     Mental Status: She is alert and oriented to person, place, and time.  Psychiatric:        Mood and Affect: Mood normal.         Behavior: Behavior normal.        Judgment: Judgment normal.          Assessment and Plan   1. Bloating   2. Fatigue, unspecified type      Plan: 1. We will refer her to allergist to undergo additional allergy/sensitivity testings for further evaluation of her bloating and abdominal pain. 2. We had a long discussion about her fatigue and that I could consider further evaluation including cardiac echocardiogram and/or testing for autoimmune disease. At this time she would like to hold off on further testing. We did discuss the off label use of desiccated thyroid which in some patients does result in improved energy levels, focus and concentration, and mood. We did discuss that because her thyroid panel results were technically in the normal range she will be given this medicine off label and that the FDA has not approved this medication for treatment of her symptoms. We also discussed symptoms of overactive thyroid and that this can occur with taking this medication. She is aware that if she tries medication and experiences cardiac palpitations, worsening anxiety, tremors, weight loss, hair loss, diarrhea, etc. that she needs to notify us right away. She is interested in trying the NP thyroid and will contemplate this. I have sent a prescription to her pharmacy, I did ask that she call us if she decides to start the medicine so that she can have a follow-up within 4 to 6 weeks after medicine has started to determine how she is tolerating it and to check blood work. She tells me she will plan on doing this.  Of note, we also discussed that if she decides to stop her Depo shot for contraception that she should use a backup method if she becomes sexually active.  She is aware that it can take a few months for periods to regulate, but she would be at risk for pregnancy without use of backup contraception in the absence of Depo shot.  She tells me she understands.  Tests ordered Orders  Placed This Encounter  Procedures  . Ambulatory referral to Allergy      Meds ordered this encounter  Medications  . thyroid (NP THYROID) 15 MG tablet    Sig: Take 1 tablet (15 mg total) by mouth daily.    Dispense:  90 tablet    Refill:  1    Order Specific Question:   Supervising Provider    Answer:   Wilson Singer [[id number]]    Patient  to follow-up in 2 months or sooner as needed.  Elenore Paddy, NP

## 2020-05-21 ENCOUNTER — Ambulatory Visit (INDEPENDENT_AMBULATORY_CARE_PROVIDER_SITE_OTHER): Payer: BC Managed Care – PPO | Admitting: Internal Medicine

## 2020-05-26 ENCOUNTER — Ambulatory Visit (INDEPENDENT_AMBULATORY_CARE_PROVIDER_SITE_OTHER): Payer: BC Managed Care – PPO | Admitting: Nurse Practitioner

## 2020-06-04 ENCOUNTER — Telehealth (INDEPENDENT_AMBULATORY_CARE_PROVIDER_SITE_OTHER): Payer: BC Managed Care – PPO | Admitting: Internal Medicine

## 2020-06-04 ENCOUNTER — Encounter (INDEPENDENT_AMBULATORY_CARE_PROVIDER_SITE_OTHER): Payer: Self-pay | Admitting: Internal Medicine

## 2020-06-04 DIAGNOSIS — J029 Acute pharyngitis, unspecified: Secondary | ICD-10-CM

## 2020-06-04 MED ORDER — AZITHROMYCIN 250 MG PO TABS: ORAL_TABLET | ORAL | 0 refills | Status: DC

## 2020-06-04 NOTE — Progress Notes (Signed)
Metrics: Intervention Frequency ACO  Documented Smoking Status Yearly  Screened one or more times in 24 months  Cessation Counseling or  Active cessation medication Past 24 months  Past 24 months   Guideline developer: UpToDate (See UpToDate for funding source) Date Released: 2014       Wellness Office Visit  Subjective:  Patient ID: Tonya Mclaughlin, female    DOB: 03/26/2001  Age: 19 y.o. MRN: [id number]  CC: This is an audio telemedicine visit with the permission of the patient who is at home and I am in my office. I used two identifiers to identify the patient. Sore throat. HPI  She has had the above symptoms for the last 4 days or so.  She denies any fever.  She notices that the soreness is on one side of her throat with possible white lesions.  She is wondering whether she has a strep throat. She is fully vaccinated with COVID-19 vaccination. Past Medical History:  Diagnosis Date  . ADHD (attention deficit hyperactivity disorder)   . Asthma   . Constipation   . Irregular bleeding 02/13/2015  . Migraine    History reviewed. No pertinent surgical history.   Family History  Problem Relation Age of Onset  . Hypertension Mother   . 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 Smoking status: Never Smoker  . Smokeless tobacco: Never Used  Substance Use Topics  . Alcohol use: No    No outpatient medications have been marked as taking for the 06/04/20 encounter (Video Visit) with Wilson Singer, MD.      Depression screen [facility] 2/9 10/04/2017 01/25/2017 12/14/2016 10/29/2016 09/17/2016  Decreased Interest 0 0 1 0 0  Down, Depressed, Hopeless 0 0 1 0 0  PHQ - 2 Score 0 0 2 0 0  Altered sleeping - 0 0 0 0  Tired, decreased energy - 0 1 0 0  Change in appetite - 0 2 0 0  Feeling bad or failure about yourself  - 0 0 0  0  Trouble concentrating - 0 0 0 0  Moving slowly or fidgety/restless - 0 0 0 0  Suicidal thoughts - 0 0 0 0  PHQ-9 Score - 0 5 0 0     Objective:   Today's Vitals: There were no vitals taken for this visit. Vitals with BMI 06/04/2020 05/15/2020 05/05/2020  Height (No Data) 5\' 2"  -  Weight (No Data) 145 lbs 13 oz -  BMI - 26.66 -  Systolic (No Data) 128 138  Diastolic (No Data) 76 98  Pulse - 95 96     Physical Exam  Her speech is normal and she appears to be alert and orientated.     Assessment   1. Sore throat       Tests ordered No orders of the defined types were placed in this encounter.    Plan: 1. I explained to her the etiology of a sore throat could be viral or bacterial.  She will try salt water gargles or even aspirin gargles first and if she does not improve or gets worse, I have sent the prescription for Zithromax in case she needs it but she will try not to take this medication if at all possible. 2. She  will follow up with me in November and we can discuss how she did at that point. 3. This phone call lasted 6 minutes and 25 sec   Meds ordered this encounter  Medications  . azithromycin (ZITHROMAX) 250 MG tablet    Sig: Take 2 tablets the first day and then 1 tablet every day for the next 4 days    Dispense:  6 tablet    Refill:  0    Josephine Rudnick Normajean Glasgow, MD

## 2020-06-10 ENCOUNTER — Encounter (INDEPENDENT_AMBULATORY_CARE_PROVIDER_SITE_OTHER): Payer: Self-pay | Admitting: Nurse Practitioner

## 2020-06-22 ENCOUNTER — Encounter (INDEPENDENT_AMBULATORY_CARE_PROVIDER_SITE_OTHER): Payer: Self-pay | Admitting: Nurse Practitioner

## 2020-06-23 ENCOUNTER — Telehealth (INDEPENDENT_AMBULATORY_CARE_PROVIDER_SITE_OTHER): Payer: Self-pay | Admitting: Nurse Practitioner

## 2020-06-23 NOTE — Telephone Encounter (Signed)
Please call patient and see if we can work her in for an appointment with me sometime this week.  I would prefer in person, however based on her concern I think I could do this virtually if necessary.

## 2020-06-26 ENCOUNTER — Encounter (INDEPENDENT_AMBULATORY_CARE_PROVIDER_SITE_OTHER): Payer: Self-pay | Admitting: Nurse Practitioner

## 2020-06-26 ENCOUNTER — Telehealth (INDEPENDENT_AMBULATORY_CARE_PROVIDER_SITE_OTHER): Payer: Self-pay | Admitting: Nurse Practitioner

## 2020-06-26 ENCOUNTER — Ambulatory Visit (INDEPENDENT_AMBULATORY_CARE_PROVIDER_SITE_OTHER): Payer: BC Managed Care – PPO | Admitting: Nurse Practitioner

## 2020-06-26 ENCOUNTER — Other Ambulatory Visit: Payer: Self-pay

## 2020-06-26 VITALS — BP 122/78 | HR 82 | Temp 97.7°F | Resp 12 | Ht 62.0 in | Wt 146.2 lb

## 2020-06-26 DIAGNOSIS — M898X8 Other specified disorders of bone, other site: Secondary | ICD-10-CM

## 2020-06-26 NOTE — Telephone Encounter (Signed)
I did order ultrasound for this patient today please run through insurance and make sure this is scheduled.  Thank you.

## 2020-06-26 NOTE — Progress Notes (Signed)
Subjective:  Patient ID: Tonya Mclaughlin, female    DOB: 08/07/2001  Age: 19 y.o. MRN: [id number]  CC:  Chief Complaint  Patient presents with  . Other    knot behind ear      HPI  This patient arrives today for the above.  This patient comes in today for acute visit for the above.  She has had a mass to the left ear in front of her tragus in the past.  The mass was fairly soft and movable.  At that time was determined was most likely enlarged lymph node.  This mass eventually went away, however the patient tells me she recently was experiencing worsening allergic symptoms and possible upper respiratory infection.  The mass did return but has since gone away again.  She does notice to hard and movable masses located on her skull behind her left ear.  These are very concerning to her in the will first noticed approximately 4 days ago.  She tells me that there bit tender to touch but otherwise do not seem to bother her much.  She has tried treating him with warm compress without much improvement in size or symptoms.  Past Medical History:  Diagnosis Date  . ADHD (attention deficit hyperactivity disorder)   . Asthma   . Constipation   . Irregular bleeding 02/13/2015  . Migraine       Family History  Problem Relation Age of Onset  . Hypertension Mother   . 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 Smoking status: Never Smoker  . Smokeless tobacco: Never Used  Substance Use Topics  . Alcohol use: No     Current Meds  Medication Sig  . Albuterol Sulfate (PROAIR RESPICLICK) 108 (90 Base) MCG/ACT AEPB Inhale 1-2 puffs into the lungs every 6 (six) hours as needed.    ROS:  Review of Systems  Constitutional: Positive for malaise/fatigue. Negative for fever.  HENT: Positive for congestion and sore throat.     Neurological: Positive for dizziness and headaches.     Objective:   Today's Vitals: BP 122/78   Pulse 82   Temp 97.7 F (36.5 C)   Resp 12   Ht 5\' 2"  (1.575 m)   Wt 146 lb 3.2 oz (66.3 kg)   LMP 06/23/2020   SpO2 97%   BMI 26.74 kg/m  Vitals with BMI 06/26/2020 06/04/2020 05/15/2020  Height 5\' 2"  (No Data) 5\' 2"   Weight 146 lbs 3 oz (No Data) 145 lbs 13 oz  BMI 26.73 - 26.66  Systolic 122 (No Data) 128  Diastolic 78 (No Data) 76  Pulse 82 - 95     Physical Exam Vitals reviewed.  Constitutional:      General: She is not in acute distress.    Appearance: Normal appearance.  HENT:     Head: Normocephalic and atraumatic.   Neck:     Vascular: No carotid bruit.  Cardiovascular:     Rate and Rhythm: Normal rate and regular rhythm.     Pulses: Normal pulses.     Heart sounds: Normal heart sounds.  Pulmonary:     Effort: Pulmonary effort is normal.     Breath sounds: Normal breath sounds.  Skin:    General:  Skin is warm and dry.  Neurological:     General: No focal deficit present.     Mental Status: She is alert and oriented to person, place, and time.  Psychiatric:        Mood and Affect: Mood normal.        Behavior: Behavior normal.        Judgment: Judgment normal.          Assessment and Plan   1. Mass of skull      Plan: 1.  Etiology uncertain at this time.  Nodules/masses noted on patient's skull behind right ear feel almost like bony prominences.  However, the patient tells me this prominence is new so we will proceed with imaging will do ultrasound may consider x-ray or CT scan if necessary.  We will also ask ultrasound technician to look at the tragus area of her left ear to see if any signs of lymphadenopathy are noted.   Tests ordered Orders Placed This Encounter  Procedures  . US Soft Tissue Head/Neck (NON-THYROID)      No orders of the defined types were placed in this encounter.   Patient to follow-up as scheduled or sooner as  needed.  Elenore Paddy, NP

## 2020-07-24 ENCOUNTER — Telehealth: Payer: Self-pay | Admitting: Adult Health

## 2020-07-24 ENCOUNTER — Telehealth (INDEPENDENT_AMBULATORY_CARE_PROVIDER_SITE_OTHER): Payer: Self-pay

## 2020-07-24 ENCOUNTER — Encounter (INDEPENDENT_AMBULATORY_CARE_PROVIDER_SITE_OTHER): Payer: Self-pay | Admitting: Nurse Practitioner

## 2020-07-24 NOTE — Telephone Encounter (Signed)
Left message @ 4:16 pm. JSY

## 2020-07-24 NOTE — Telephone Encounter (Signed)
Patient requesting a call back, she has questions about birth control

## 2020-07-24 NOTE — Telephone Encounter (Signed)
I have replied via message in Tonya Mclaughlin regarding her concern.

## 2020-07-25 ENCOUNTER — Other Ambulatory Visit: Payer: Self-pay

## 2020-07-25 ENCOUNTER — Ambulatory Visit (HOSPITAL_COMMUNITY)
Admission: RE | Admit: 2020-07-25 | Discharge: 2020-07-25 | Disposition: A | Discharge status: Home / Self Care | Payer: BC Managed Care – PPO | Source: Ambulatory Visit | Attending: Nurse Practitioner | Admitting: Nurse Practitioner

## 2020-07-25 DIAGNOSIS — M898X8 Other specified disorders of bone, other site: Secondary | ICD-10-CM | POA: Insufficient documentation

## 2020-07-25 NOTE — Telephone Encounter (Signed)
Left message @ 12:54 pm. JSY

## 2020-07-28 NOTE — Telephone Encounter (Signed)
Left message @ 9:15 am. JSY

## 2020-07-28 NOTE — Telephone Encounter (Addendum)
Pt is currently not on birth control. Pt has heard of Phexxi and would like to know more about it. Pt's most recent birth control was Depo. Please advise through her MyChart. Thanks!! JSY

## 2020-07-30 ENCOUNTER — Other Ambulatory Visit: Payer: Self-pay

## 2020-07-30 ENCOUNTER — Ambulatory Visit (INDEPENDENT_AMBULATORY_CARE_PROVIDER_SITE_OTHER): Payer: BC Managed Care – PPO | Admitting: Nurse Practitioner

## 2020-07-30 ENCOUNTER — Encounter (INDEPENDENT_AMBULATORY_CARE_PROVIDER_SITE_OTHER): Payer: Self-pay | Admitting: Nurse Practitioner

## 2020-07-30 VITALS — BP 120/80 | HR 87 | Temp 97.9°F | Resp 18 | Ht 62.0 in | Wt 146.0 lb

## 2020-07-30 DIAGNOSIS — J4540 Moderate persistent asthma, uncomplicated: Secondary | ICD-10-CM

## 2020-07-30 MED ORDER — ALBUTEROL SULFATE 108 (90 BASE) MCG/ACT IN AEPB: 1.0000 | INHALATION_SPRAY | Freq: Four times a day (QID) | RESPIRATORY_TRACT | 3 refills | Status: DC | PRN

## 2020-07-30 MED ORDER — ALBUTEROL SULFATE (2.5 MG/3ML) 0.083% IN NEBU: 2.5000 mg | INHALATION_SOLUTION | Freq: Four times a day (QID) | RESPIRATORY_TRACT | 1 refills | Status: AC | PRN

## 2020-07-30 NOTE — Progress Notes (Signed)
Subjective:  Patient ID: Tonya Mclaughlin, female    DOB: 09-13-2001  Age: 19 y.o. MRN: [id number]  CC:  Chief Complaint  Patient presents with  . Asthma      HPI  This patient arrives today for the above.  She tells me over the last couple of weeks she has been experiencing mild increase in her asthma symptoms.  She has not been experiencing much more coughing or wheezing, but has experienced some chest tightness.  She needs a refill on her albuterol inhaler as well as nebulizer solution.  Past Medical History:  Diagnosis Date  . ADHD (attention deficit hyperactivity disorder)   . Asthma   . Constipation   . Irregular bleeding 02/13/2015  . Migraine       Family History  Problem Relation Age of Onset  . Hypertension Mother   . 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 Smoking status: Never Smoker  . Smokeless tobacco: Never Used  Substance Use Topics  . Alcohol use: No     Current Meds  Medication Sig  . Albuterol Sulfate (PROAIR RESPICLICK) 108 (90 Base) MCG/ACT AEPB Inhale 1-2 puffs into the lungs every 6 (six) hours as needed.  . [DISCONTINUED] Albuterol Sulfate (PROAIR RESPICLICK) 108 (90 Base) MCG/ACT AEPB Inhale 1-2 puffs into the lungs every 6 (six) hours as needed.    ROS:  Review of Systems  Respiratory: Positive for cough and wheezing.      Objective:   Today's Vitals: BP 120/80 (BP Location: Right Arm, Patient Position: Sitting, Cuff Size: Normal)   Pulse 87   Temp 97.9 F (36.6 C) (Temporal)   Resp 18   Ht 5\' 2"  (1.575 m)   Wt 146 lb (66.2 kg)   SpO2 98%   BMI 26.70 kg/m  Vitals with BMI 07/30/2020 06/26/2020 06/04/2020  Height 5\' 2"  5\' 2"  (No Data)  Weight 146 lbs 146 lbs 3 oz (No Data)  BMI 26.7 26.73 -  Systolic 120 122 (No Data)  Diastolic 80 78 (No Data)  Pulse 87 82  -     Physical Exam Vitals reviewed.  Constitutional:      General: She is not in acute distress.    Appearance: Normal appearance.  HENT:     Head: Normocephalic and atraumatic.  Neck:     Vascular: No carotid bruit.  Cardiovascular:     Rate and Rhythm: Normal rate and regular rhythm.     Pulses: Normal pulses.     Heart sounds: Normal heart sounds.  Pulmonary:     Effort: Pulmonary effort is normal.     Breath sounds: Normal breath sounds.  Skin:    General: Skin is warm and dry.  Neurological:     General: No focal deficit present.     Mental Status: She is alert and oriented to person, place, and time.  Psychiatric:        Mood and Affect: Mood normal.        Behavior: Behavior normal.        Judgment: Judgment normal.          Assessment and Plan   1. Moderate persistent asthma without complication      Plan: 1.  No wheezing noted on  exam vital signs are stable.  Will refill albuterol inhaler and prescribe albuterol nebulizer solution.  She was cautioned to take either as needed every 6 hours, and not to combine her take either medication more than every 6 hours.  She tells me she understands.   Tests ordered Orders Placed This Encounter  Procedures  . For home use only DME Other see comment      Meds ordered this encounter  Medications  . Albuterol Sulfate (PROAIR RESPICLICK) 108 (90 Base) MCG/ACT AEPB    Sig: Inhale 1-2 puffs into the lungs every 6 (six) hours as needed.    Dispense:  1 each    Refill:  3    Order Specific Question:   Supervising Provider    Answer:   Lilly Cove C [[id number]]  . albuterol (PROVENTIL) (2.5 MG/3ML) 0.083% nebulizer solution    Sig: Take 3 mLs (2.5 mg total) by nebulization every 6 (six) hours as needed for wheezing or shortness of breath.    Dispense:  150 mL    Refill:  1    Order Specific Question:   Supervising Provider    Answer:   Wilson Singer [[id number]]    Patient to follow-up as scheduled next week  or sooner as needed.  Elenore Paddy, NP

## 2020-07-31 ENCOUNTER — Other Ambulatory Visit (INDEPENDENT_AMBULATORY_CARE_PROVIDER_SITE_OTHER): Payer: Self-pay | Admitting: Nurse Practitioner

## 2020-07-31 ENCOUNTER — Encounter (INDEPENDENT_AMBULATORY_CARE_PROVIDER_SITE_OTHER): Payer: Self-pay | Admitting: Nurse Practitioner

## 2020-07-31 DIAGNOSIS — E559 Vitamin D deficiency, unspecified: Secondary | ICD-10-CM

## 2020-07-31 DIAGNOSIS — R591 Generalized enlarged lymph nodes: Secondary | ICD-10-CM

## 2020-08-06 ENCOUNTER — Encounter (INDEPENDENT_AMBULATORY_CARE_PROVIDER_SITE_OTHER): Payer: Self-pay | Admitting: Nurse Practitioner

## 2020-08-06 ENCOUNTER — Ambulatory Visit (INDEPENDENT_AMBULATORY_CARE_PROVIDER_SITE_OTHER): Payer: BC Managed Care – PPO | Admitting: Nurse Practitioner

## 2020-08-06 ENCOUNTER — Other Ambulatory Visit: Payer: Self-pay

## 2020-08-06 VITALS — BP 128/80 | HR 82 | Temp 98.1°F | Ht 62.0 in | Wt 146.1 lb

## 2020-08-06 DIAGNOSIS — J4540 Moderate persistent asthma, uncomplicated: Secondary | ICD-10-CM

## 2020-08-06 DIAGNOSIS — J029 Acute pharyngitis, unspecified: Secondary | ICD-10-CM

## 2020-08-06 DIAGNOSIS — Z309 Encounter for contraceptive management, unspecified: Secondary | ICD-10-CM | POA: Diagnosis not present

## 2020-08-06 DIAGNOSIS — R591 Generalized enlarged lymph nodes: Secondary | ICD-10-CM

## 2020-08-06 DIAGNOSIS — R109 Unspecified abdominal pain: Secondary | ICD-10-CM

## 2020-08-06 LAB — PREGNANCY INDUCED HYPERTENSION  PRECAUTIONS: Preg Test, Ur: NEGATIVE

## 2020-08-06 LAB — POCT RAPID STREP A: Strep A Ag: NEGATIVE

## 2020-08-06 MED ORDER — MEDROXYPROGESTERONE ACETATE 150 MG/ML IM SUSP: 150.0000 mg | Freq: Once | INTRAMUSCULAR | Status: DC

## 2020-08-06 NOTE — Progress Notes (Signed)
Subjective:  Patient ID: [name], female    DOB: July 02, 2001  Age: 19 y.o. MRN: [id number]  CC:  Chief Complaint  Patient presents with  . Follow-up    Depo injection, has appt with ENT in December  . Contraception  . Abdominal Pain  . Sore Throat  . Lymphadenopathy  . Asthma      HPI  This patient arrives today for the above.  Contraception: She is here to reinitiate her Depo injections for contraception.  She is due to have pregnancy test to rule out pregnancy as well.  She did start her period approximately 8 days ago.  Abdominal pain: She mentions to me she is been having intermittent issues with her digestion.  She mentioned this to me in the past and has undergone extensive evaluation with gastroenterology.  She was interested in having allergy testing to determine if she has any specific food and sensitivities, however when we referred her to allergist they recommended she discuss this with her gastroenterologist.  She states she is discussed this with them in the past but they stated food intolerance or sensitivity testing would not yield much benefit.  Sore throat: She had a sore throat for approximately 1 week.  She has had COVID-19 infection testing and she was found to be negative.  She has scheduled herself an appoint with an ear nose and throat doctor for further evaluation as she has had repeat tonsillitis infections, and is interested in pursuing possibly removing her tonsils.  Lymphadenopathy: She did have multiple concerning masses that would grow and reduce in size intermittently.  She did undergo ultrasound for these and ultrasound radiologist noted that most likely these masses are reactive lymph nodes without concerning morphology noted.  I did discuss this with Dr. Karilyn Cota as the patient was concerned about why the lymph node keeps growing and reducing in size.  He recommended trying to check her for generalized inflammatory markers and if these are elevated  consider referral to surgeon for biopsy.  She would like to proceed with having inflammatory markers checked today for further evaluation.  Asthma: She tells me that her breathing is a bit worse than her baseline.  She is been taking her albuterol as needed, but she has been trying to minimize its use due to side effects of jitteriness.  She was wondering if it would hurt her to take it daily.  Past Medical History:  Diagnosis Date  . ADHD (attention deficit hyperactivity disorder)   . Asthma   . Constipation   . Irregular bleeding 02/13/2015  . Migraine       Family History  Problem Relation Age of Onset  . Hypertension Mother   . 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 Smoking status: Never Smoker  . Smokeless tobacco: Never Used  Substance Use Topics  . Alcohol use: No     Current Meds  Medication Sig  . albuterol (PROVENTIL) (2.5 MG/3ML) 0.083% nebulizer solution Take 3 mLs (2.5 mg total) by nebulization every 6 (six) hours as needed for wheezing or shortness of breath.  . Albuterol Sulfate (PROAIR RESPICLICK) 108 (90 Base) MCG/ACT AEPB Inhale 1-2 puffs into the lungs every 6 (six) hours as needed.  . Cranberry 250 MG  CAPS Take 1 capsule by mouth daily.  . methocarbamol (ROBAXIN) 750 MG tablet Take 750 mg by mouth every 8 (eight) hours as needed. For back spasm .  Marland Kitchen Multiple Vitamins-Minerals (ONE-A-DAY WOMENS PO) Take 1 tablet by mouth.   Current Facility-Administered Medications for the 08/06/20 encounter (Office Visit) with Elenore Paddy, NP  Medication  . medroxyPROGESTERone (DEPO-PROVERA) injection 150 mg    ROS:  Review of Systems  Constitutional: Negative for fever.  HENT: Positive for sore throat.   Respiratory: Positive for cough and wheezing. Negative for shortness of breath.     Cardiovascular: Negative for chest pain.     Objective:   Today's Vitals: BP 128/80   Pulse 82   Temp 98.1 F (36.7 C) (Temporal)   Ht 5\' 2"  (1.575 m)   Wt 146 lb 1.6 oz (66.3 kg)   LMP 07/29/2020   SpO2 96%   BMI 26.72 kg/m  Vitals with BMI 08/06/2020 07/30/2020 06/26/2020  Height 5\' 2"  5\' 2"  5\' 2"   Weight 146 lbs 2 oz 146 lbs 146 lbs 3 oz  BMI 26.72 26.7 26.73  Systolic 128 120 06/28/2020  Diastolic 80 80 78  Pulse 82 87 82     Physical Exam Vitals reviewed.  Constitutional:      General: She is not in acute distress.    Appearance: Normal appearance.  HENT:     Head: Normocephalic and atraumatic.  Neck:     Vascular: No carotid bruit.  Cardiovascular:     Rate and Rhythm: Normal rate and regular rhythm.     Pulses: Normal pulses.     Heart sounds: Normal heart sounds.  Pulmonary:     Effort: Pulmonary effort is normal.     Breath sounds: Examination of the left-upper field reveals wheezing. Wheezing present.  Skin:    General: Skin is warm and dry.  Neurological:     General: No focal deficit present.     Mental Status: She is alert and oriented to person, place, and time.  Psychiatric:        Mood and Affect: Mood normal.        Behavior: Behavior normal.        Judgment: Judgment normal.      POC Pregnancy: Negative POC Strep A: Negative    Assessment and Plan   1. Encounter for contraceptive management, unspecified type   2. Abdominal pain, unspecified abdominal location   3. Sore throat   4. Lymphadenopathy of head and neck   5. Moderate persistent asthma without complication      Plan: 1.  Depo injection administered today.  She will follow-up in 12 weeks to have it administered again.  I did forget to tell her to use additional contraceptive method for the next 7 days, however I have sent her a message in her MyChart which she does routinely check regarding this. 2.  Encouraged her to consider following up with gastroenterology.  We also  briefly discussed FODMAP diet and that trying this may help her identify specific triggering foods.  She will consider this. 3.  Strep negative, she was encouraged to follow-up with ENT as scheduled. 4.  We will await inflammatory markers and if elevated will refer to general surgery for biopsy. 5.  I encouraged her to use her albuterol as needed every 6 hours.  We also talked about initiating inhaled corticosteroid, however for now she would like to hold off on this.  I told her if her breathing  requires use of albuterol daily or multiple times a day for prolonged period time which I defined as approximately 2 months and we really should consider adding inhaled corticosteroid therapy.  She tells me she understands.   Tests ordered Orders Placed This Encounter  Procedures  . Pregnancy, urine  . Pregnancy induced hypertension precautions      Meds ordered this encounter  Medications  . medroxyPROGESTERone (DEPO-PROVERA) injection 150 mg    Patient to follow-up as scheduled later this month and in February for Depo injection.  Elenore Paddy, NP

## 2020-08-06 NOTE — Progress Notes (Signed)
Pt given Depo 150 mg/ml ; 1 Ml to the lft arm.Pt tolerated well; no complaints. Pt brought own medication from [facility]. [phone] [id number]:[id number].

## 2020-08-06 NOTE — Patient Instructions (Signed)
Low-FODMAP Eating Plan  FODMAPs (fermentable oligosaccharides, disaccharides, monosaccharides, and polyols) are sugars that are hard for some people to digest. A low-FODMAP eating plan may help some people who have bowel (intestinal) diseases to manage their symptoms. This meal plan can be complicated to follow. Work with a diet and nutrition specialist (dietitian) to make a low-FODMAP eating plan that is right for you. A dietitian can make sure that you get enough nutrition from this diet. What are tips for following this plan? Reading food labels  Check labels for hidden FODMAPs such as: ? High-fructose syrup. ? Honey. ? Agave. ? Natural fruit flavors. ? Onion or garlic powder.  Choose low-FODMAP foods that contain 3-4 grams of fiber per serving.  Check food labels for serving sizes. Eat only one serving at a time to make sure FODMAP levels stay low. Meal planning  Follow a low-FODMAP eating plan for up to 6 weeks, or as told by your health care provider or dietitian.  To follow the eating plan: 1. Eliminate high-FODMAP foods from your diet completely. 2. Gradually reintroduce high-FODMAP foods into your diet one at a time. Most people should wait a few days after introducing one high-FODMAP food before they introduce the next high-FODMAP food. Your dietitian can recommend how quickly you may reintroduce foods. 3. Keep a daily record of what you eat and drink, and make note of any symptoms that you have after eating. 4. Review your daily record with a dietitian regularly. Your dietitian can help you identify which foods you can eat and which foods you should avoid. General tips  Drink enough fluid each day to keep your urine pale yellow.  Avoid processed foods. These often have added sugar and may be high in FODMAPs.  Avoid most dairy products, whole grains, and sweeteners.  Work with a dietitian to make sure you get enough fiber in your diet. Recommended  foods Grains  Gluten-free grains, such as rice, oats, buckwheat, quinoa, corn, polenta, and millet. Gluten-free pasta, bread, or cereal. Rice noodles. Corn tortillas. Vegetables  Eggplant, zucchini, cucumber, peppers, green beans, Brussels sprouts, bean sprouts, lettuce, arugula, kale, Swiss chard, spinach, collard greens, bok choy, summer squash, potato, and tomato. Limited amounts of corn, carrot, and sweet potato. Green parts of scallions. Fruits  Bananas, oranges, lemons, limes, blueberries, raspberries, strawberries, grapes, cantaloupe, honeydew melon, kiwi, papaya, passion fruit, and pineapple. Limited amounts of dried cranberries, banana chips, and shredded coconut. Dairy  Lactose-free milk, yogurt, and kefir. Lactose-free cottage cheese and ice cream. Non-dairy milks, such as almond, coconut, hemp, and rice milk. Yogurts made of non-dairy milks. Limited amounts of goat cheese, brie, mozzarella, parmesan, swiss, and other hard cheeses. Meats and other protein foods  Unseasoned beef, pork, poultry, or fish. Eggs. Bacon. Tofu (firm) and tempeh. Limited amounts of nuts and seeds, such as almonds, walnuts, brazil nuts, pecans, peanuts, pumpkin seeds, chia seeds, and sunflower seeds. Fats and oils  Butter-free spreads. Vegetable oils, such as olive, canola, and sunflower oil. Seasoning and other foods  Artificial sweeteners with names that do not end in "ol" such as aspartame, saccharine, and stevia. Maple syrup, white table sugar, raw sugar, brown sugar, and molasses. Fresh basil, coriander, parsley, rosemary, and thyme. Beverages  Water and mineral water. Sugar-sweetened soft drinks. Small amounts of orange juice or cranberry juice. Black and green tea. Most dry wines. Coffee. This may not be a complete list of low-FODMAP foods. Talk with your dietitian for more information. Foods to avoid Grains  Wheat,   including kamut, durum, and semolina. Barley and bulgur. Couscous. Wheat-based  cereals. Wheat noodles, bread, crackers, and pastries. Vegetables  Chicory root, artichoke, asparagus, cabbage, snow peas, sugar snap peas, mushrooms, and cauliflower. Onions, garlic, leeks, and the white part of scallions. Fruits  Fresh, dried, and juiced forms of apple, pear, watermelon, peach, plum, cherries, apricots, blackberries, boysenberries, figs, nectarines, and mango. Avocado. Dairy  Milk, yogurt, ice cream, and soft cheese. Cream and sour cream. Milk-based sauces. Custard. Meats and other protein foods  Fried or fatty meat. Sausage. Cashews and pistachios. Soybeans, baked beans, black beans, chickpeas, kidney beans, fava beans, navy beans, lentils, and split peas. Seasoning and other foods  Any sugar-free gum or candy. Foods that contain artificial sweeteners such as sorbitol, mannitol, isomalt, or xylitol. Foods that contain honey, high-fructose corn syrup, or agave. Bouillon, vegetable stock, beef stock, and chicken stock. Garlic and onion powder. Condiments made with onion, such as hummus, chutney, pickles, relish, salad dressing, and salsa. Tomato paste. Beverages  Chicory-based drinks. Coffee substitutes. Chamomile tea. Fennel tea. Sweet or fortified wines such as port or sherry. Diet soft drinks made with isomalt, mannitol, maltitol, sorbitol, or xylitol. Apple, pear, and mango juice. Juices with high-fructose corn syrup. This may not be a complete list of high-FODMAP foods. Talk with your dietitian to discuss what dietary choices are best for you.  Summary  A low-FODMAP eating plan is a short-term diet that eliminates FODMAPs from your diet to help ease symptoms of certain bowel diseases.  The eating plan usually lasts up to 6 weeks. After that, high-FODMAP foods are restarted gradually, one at a time, so you can find out which may be causing symptoms.  A low-FODMAP eating plan can be complicated. It is best to work with a dietitian who has experience with this type of  plan. This information is not intended to replace advice given to you by your health care provider. Make sure you discuss any questions you have with your health care provider. Document Revised: 08/26/2017 Document Reviewed: 05/10/2017 Elsevier Patient Education  2020 [employer]  

## 2020-08-06 NOTE — Addendum Note (Signed)
Addended by: Elenore Paddy on: 08/06/2020 03:48 PM   Modules accepted: Orders

## 2020-08-11 ENCOUNTER — Telehealth (INDEPENDENT_AMBULATORY_CARE_PROVIDER_SITE_OTHER): Payer: Self-pay | Admitting: Nurse Practitioner

## 2020-08-11 NOTE — Telephone Encounter (Signed)
Called patient and gave her the message and the patient verbalized an understanding. Patient stated that she gets off at 5pm and not sure if she will be able to get the labs done in time. Patient did state that she has an appointment on 08/18/2020 and can get done on that day if we have a lab person available. Patient verbalized an understanding.

## 2020-08-11 NOTE — Telephone Encounter (Signed)
Please call this patient and let her know that the reason her inflammatory markers have not yet resulted is because for some reason only half of the blood work orders from last week were actually drawn and sent off. I called the lab and they are going to attempt to add-on one of the inflammatory markers to the blood they have from the other tests, they are unable to add the other test due to the types of tubes her blood was transported in. Thus, to complete the testing she will need to have blood redrawn. I will know tomorrow if she needs both tests reordered or just one. So we can update her tomorrow about what tests she needs to have redrawn. In addition, (please verify this) but I do not believe we have a phlebotomist available this week. So once we know which test(s) she will need redrawn (we should know this tomorrow and I will ask you to call and update her) she can either try to schedule an appointment at the [facility] on [address], or she can try to come to our office next week (hopefully we will have a phlebotomist next week available to [place]).   I will send you a message tomorrow as well about what our next steps are. I just wanted to make sure she was updated on the situation. Thank you.

## 2020-08-12 LAB — COMPLETE METABOLIC PANEL WITH GFR
AG Ratio: 1.9 (calc) (ref 1.0–2.5)
ALT: 12 U/L (ref 6–29)
AST: 16 U/L (ref 10–30)
Albumin: 4.6 g/dL (ref 3.6–5.1)
Alkaline phosphatase (APISO): 48 U/L (ref 31–125)
BUN: 15 mg/dL (ref 7–25)
CO2: 28 mmol/L (ref 20–32)
Calcium: 9.6 mg/dL (ref 8.6–10.2)
Chloride: 105 mmol/L (ref 98–110)
Creat: 0.93 mg/dL (ref 0.50–1.10)
GFR, Est African American: 103 mL/min/{1.73_m2} (ref >=60)
GFR, Est Non African American: 88 mL/min/{1.73_m2} (ref >=60)
Globulin: 2.4 g/dL (calc) (ref 1.9–3.7)
Glucose, Bld: 88 mg/dL (ref 65–99)
Potassium: 4.2 mmol/L (ref 3.5–5.3)
Sodium: 139 mmol/L (ref 135–146)
Total Bilirubin: 0.9 mg/dL (ref 0.2–1.2)
Total Protein: 7 g/dL (ref 6.1–8.1)

## 2020-08-12 LAB — VITAMIN D 25 HYDROXY (VIT D DEFICIENCY, FRACTURES): Vit D, 25-Hydroxy: 28 ng/mL — ABNORMAL LOW (ref 30–100)

## 2020-08-12 LAB — C-REACTIVE PROTEIN: CRP: 0.3 mg/L (ref <8.0)

## 2020-08-18 ENCOUNTER — Other Ambulatory Visit: Payer: Self-pay

## 2020-08-18 ENCOUNTER — Ambulatory Visit (INDEPENDENT_AMBULATORY_CARE_PROVIDER_SITE_OTHER): Payer: BC Managed Care – PPO | Admitting: Internal Medicine

## 2020-08-18 ENCOUNTER — Encounter (INDEPENDENT_AMBULATORY_CARE_PROVIDER_SITE_OTHER): Payer: Self-pay | Admitting: Internal Medicine

## 2020-08-18 VITALS — BP 127/70 | HR 78 | Temp 97.7°F | Resp 18 | Ht 63.0 in | Wt 147.0 lb

## 2020-08-18 DIAGNOSIS — R5383 Other fatigue: Secondary | ICD-10-CM | POA: Diagnosis not present

## 2020-08-18 DIAGNOSIS — L659 Nonscarring hair loss, unspecified: Secondary | ICD-10-CM

## 2020-08-18 DIAGNOSIS — L709 Acne, unspecified: Secondary | ICD-10-CM

## 2020-08-18 DIAGNOSIS — E559 Vitamin D deficiency, unspecified: Secondary | ICD-10-CM | POA: Diagnosis not present

## 2020-08-18 MED ORDER — NP THYROID 30 MG PO TABS
30.0000 mg | ORAL_TABLET | Freq: Every day | ORAL | 3 refills | Status: DC
Start: 2020-08-18 — End: 2020-12-30

## 2020-08-18 NOTE — Progress Notes (Signed)
Metrics: Intervention Frequency ACO  Documented Smoking Status Yearly  Screened one or more times in 24 months  Cessation Counseling or  Active cessation medication Past 24 months  Past 24 months   Guideline developer: UpToDate (See UpToDate for funding source) Date Released: 2014       Wellness Office Visit  Subjective:  Patient ID: Tonya Mclaughlin, female    DOB: 10-04-2000  Age: 19 y.o. MRN: [id number]  CC: This lady comes in for follow-up regarding her symptoms of what appears to be lymphadenopathy, malaise, fatigue, hair loss, difficulty with concentration and history of acne. HPI  Tonya Mclaughlin had been seeing the patient for intermittent lymphadenopathy.  C-reactive protein was actually normal.  She mentioned a couple of lymph nodes she has 1 behind the right ear and another 1 in front of the left ear.  She is due to see ENT next week I believe.  She does not have any lymph nodes anywhere else. She also described other symptoms above. Blood work previously has shown vitamin D deficiency and she is not taking adequate vitamin D. She also has a history of acne but she is on Depo- Provera and LH/FSH levels may not be representative of diagnosis of PCOS.  I will need to look into this. Tonya Mclaughlin had prescribed NP thyroid 15 mg daily but the patient had been hesitant to start it and therefore the patient has not started it. Past Medical History:  Diagnosis Date  . ADHD (attention deficit hyperactivity disorder)   . Asthma   . Constipation   . Irregular bleeding 02/13/2015  . Migraine    History reviewed. No pertinent surgical history.   Family History  Problem Relation Age of Onset  . Hypertension Mother   . 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   Social History   Tobacco Use  . Smoking status: Never Smoker  . Smokeless tobacco: Never Used   Substance Use Topics  . Alcohol use: No    Current Meds  Medication Sig  . albuterol (PROVENTIL) (2.5 MG/3ML) 0.083% nebulizer solution Take 3 mLs (2.5 mg total) by nebulization every 6 (six) hours as needed for wheezing or shortness of breath.  . Albuterol Sulfate (PROAIR RESPICLICK) 108 (90 Base) MCG/ACT AEPB Inhale 1-2 puffs into the lungs every 6 (six) hours as needed.  . Cranberry 250 MG CAPS Take 1 capsule by mouth daily.  . methocarbamol (ROBAXIN) 750 MG tablet Take 750 mg by mouth every 8 (eight) hours as needed. For back spasm .  Marland Kitchen Multiple Vitamins-Minerals (ONE-A-DAY WOMENS PO) Take 1 tablet by mouth.   Current Facility-Administered Medications for the 08/18/20 encounter (Office Visit) with Wilson Singer, MD  Medication  . medroxyPROGESTERone (DEPO-PROVERA) injection 150 mg      Depression screen [facility] 2/9 08/18/2020 10/04/2017 01/25/2017 12/14/2016 10/29/2016  Decreased Interest 1 0 0 1 0  Down, Depressed, Hopeless 3 0 0 1 0  PHQ - 2 Score 4 0 0 2 0  Altered sleeping 3 - 0 0 0  Tired, decreased energy 3 - 0 1 0  Change in appetite 2 - 0 2 0  Feeling bad or failure about yourself  0 - 0 0 0  Trouble concentrating 3 - 0 0 0  Moving slowly or fidgety/restless 1 - 0 0 0  Suicidal thoughts  0 - 0 0 0  PHQ-9 Score 16 - 0 5 0     Objective:   Today's Vitals: BP 127/70 (BP Location: Right Arm, Patient Position: Sitting, Cuff Size: Normal)   Pulse 78   Temp 97.7 F (36.5 C) (Temporal)   Resp 18   Ht 5\' 3"  (1.6 m)   Wt 147 lb (66.7 kg)   LMP 07/29/2020   SpO2 99%   BMI 26.04 kg/m  Vitals with BMI 08/18/2020 08/06/2020 07/30/2020  Height 5\' 3"  5\' 2"  5\' 2"   Weight 147 lbs 146 lbs 2 oz 146 lbs  BMI 26.05 26.72 26.7  Systolic 127 128 13/11/2019  Diastolic 70 80 80  Pulse 78 82 87     Physical Exam   She looks systemically well.  Overweight.  Blood pressure acceptable.  Acne on her face.  Lymph nodes in question of very small, less than pea-sized shape and mobile.  They do  not appear to be worrisome.  There is no neck lymphadenopathy.  There is no thyroid megaly.    Assessment   1. Vitamin D insufficiency   2. Fatigue, unspecified type   3. Hair loss   4. Acne, unspecified acne type       Tests ordered No orders of the defined types were placed in this encounter.    Plan: 1. I recommend that she start taking vitamin D3 10,000 units daily for vitamin D deficiency which is quite severe. 2. Some of the symptoms do have features of thyroid hypofunction and after shared decision making, she is agreeable to trying NP thyroid 30 mg daily and I have sent a prescription to the pharmacy.  I have explained possible side effects and how to deal with them. 3. I also discussed nutrition in more detail today with the concept of intermittent fasting combined with more of a plant-based diet.  She is keen to reduce visceral fat.  She does strength training on a regular basis but would like to reduce visceral fat. 4. I will see her back in a couple of months time to see how she is doing and we may do further blood work then. 5. Today I spent 45 minutes with this patient discussing previous blood work in detail as well as nutrition in detail.   Meds ordered this encounter  Medications  . NP THYROID 30 MG tablet    Sig: Take 1 tablet (30 mg total) by mouth daily before breakfast.    Dispense:  30 tablet    Refill:  3    Crystelle Ferrufino , MD

## 2020-09-18 ENCOUNTER — Ambulatory Visit: Payer: BC Managed Care – PPO

## 2020-10-05 ENCOUNTER — Encounter: Payer: Self-pay | Admitting: Emergency Medicine

## 2020-10-05 ENCOUNTER — Other Ambulatory Visit: Payer: Self-pay

## 2020-10-05 ENCOUNTER — Ambulatory Visit
Admission: EM | Admit: 2020-10-05 | Discharge: 2020-10-05 | Disposition: A | Discharge status: Home / Self Care | Payer: Self-pay | Attending: Family Medicine | Admitting: Family Medicine

## 2020-10-05 DIAGNOSIS — R519 Headache, unspecified: Secondary | ICD-10-CM

## 2020-10-05 DIAGNOSIS — J029 Acute pharyngitis, unspecified: Secondary | ICD-10-CM

## 2020-10-05 DIAGNOSIS — R509 Fever, unspecified: Secondary | ICD-10-CM

## 2020-10-05 DIAGNOSIS — B349 Viral infection, unspecified: Secondary | ICD-10-CM

## 2020-10-05 LAB — POCT RAPID STREP A (OFFICE): Rapid Strep A Screen: NEGATIVE

## 2020-10-05 NOTE — ED Triage Notes (Signed)
C/o sore throat since Saturday.  Now headache cough, and fever since last night.  Has been taking tylenol for fever

## 2020-10-05 NOTE — ED Provider Notes (Signed)
[facility]   [id number] 10/05/20 Arrival Time: 1001   CC: COVID symptoms  SUBJECTIVE: History from: patient.  Tonya Mclaughlin is a 20 y.o. female who presents with sore throat, headache, fever x 3 days. Positive Covid exposure at work. Denies sick exposure to COVID, flu or strep. Denies recent travel. Has negative history of Covid. Has completed Covid vaccines. Has not completed booster vaccine. Has not had flu vaccine this year. Has not taken OTC medications for this. Sore throat is worse with swallowing. Denies previous symptoms in the past. Denies chills, fatigue, sinus pain, rhinorrhea, SOB, wheezing, chest pain, nausea, changes in bowel or bladder habits. Reports that she had negative rapid Covid test x 3 days ago. Has PCR Covid test pending.  ROS: As per HPI.  All other pertinent ROS negative.     Past Medical History:  Diagnosis Date  . ADHD (attention deficit hyperactivity disorder)   . Asthma   . Constipation   . Irregular bleeding 02/13/2015  . Migraine    History reviewed. No pertinent surgical history. Allergies  Allergen Reactions  . Penicillins     Doesn't work for her   Current Facility-Administered Medications on File Prior to Encounter  Medication Dose Route Frequency Provider Last Rate Last Admin  . medroxyPROGESTERone (DEPO-PROVERA) injection 150 mg  150 mg Intramuscular Once Elenore Paddy, NP       Current Outpatient Medications on File Prior to Encounter  Medication Sig Dispense Refill  . albuterol (PROVENTIL) (2.5 MG/3ML) 0.083% nebulizer solution Take 3 mLs (2.5 mg total) by nebulization every 6 (six) hours as needed for wheezing or shortness of breath. 150 mL 1  . Albuterol Sulfate (PROAIR RESPICLICK) 108 (90 Base) MCG/ACT AEPB Inhale 1-2 puffs into the lungs every 6 (six) hours as needed. 1 each 3  . Cranberry 250 MG CAPS Take 1 capsule by mouth daily.    . methocarbamol (ROBAXIN) 750 MG tablet Take 750 mg by mouth every 8 (eight) hours as needed.  For back spasm .    Marland Kitchen Multiple Vitamins-Minerals (ONE-A-DAY WOMENS PO) Take 1 tablet by mouth.    . NP THYROID 30 MG tablet Take 1 tablet (30 mg total) by mouth daily before breakfast. 30 tablet 3   Social History   Socioeconomic History  . Marital status: Single    Spouse name: Not on file  . Number of children: Not on file  . Years of education: 34  . Highest education level: Not on file  Occupational History  . Occupation: Consulting civil engineer  Tobacco Use  . Smoking status: Never Smoker  . Smokeless tobacco: Never Used  Vaping Use  . Vaping Use: Never used  Substance and Sexual Activity  . Alcohol use: No  . Drug use: No  . Sexual activity: Yes    Birth control/protection: Injection  Other Topics Concern  . 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   Social Determinants of Health   Financial Resource Strain: Not on file  Food Insecurity: Not on file  Transportation Needs: Not on file  Physical Activity: Not on file  Stress: Not on file  Social Connections: Not on file  Intimate Partner Violence: Not on file   Family History  Problem Relation Age of Onset  . Hypertension Mother   . Migraines Mother     OBJECTIVE:  Vitals:  10/05/20 1020 10/05/20 1030  BP: 133/86   Pulse: 97   Resp: 16   Temp: (!) 100.4 F (38 C)   TempSrc: Oral   SpO2: 97%   Weight:  145 lb (65.8 kg)  Height:  5\' 2"  (1.575 m)     General appearance: alert; appears fatigued, but nontoxic; speaking in full sentences and tolerating own secretions HEENT: NCAT; Ears: EACs clear, TMs pearly gray; Eyes: PERRL.  EOM grossly intact. Sinuses: nontender; Nose: nares patent with clear rhinorrhea, Throat: oropharynx erythematous, cobblestoning present, tonsils erythematous, 2+ enlarged, uvula midline  Neck: supple without LAD Lungs: unlabored respirations, symmetrical air entry; cough:  absent; no respiratory distress; CTAB Heart: regular rate and rhythm.  Radial pulses 2+ symmetrical bilaterally Skin: warm and dry Psychological: alert and cooperative; normal mood and affect  LABS:  Results for orders placed or performed during the hospital encounter of 10/05/20 (from the past 24 hour(s))  POCT rapid strep A     Status: None   Collection Time: 10/05/20 10:50 AM  Result Value Ref Range   Rapid Strep A Screen Negative Negative     ASSESSMENT & PLAN:  1. Viral illness   2. Fever, unspecified fever cause   3. Sore throat   4. Nonintractable headache, unspecified chronicity pattern, unspecified headache type    Your rapid strep test is negative.  A throat culture is pending; we will call you if it is positive requiring treatment.    Continue supportive care at home COVID and flu testing ordered.  It will take between 2-3 days for test results. Someone will contact you regarding abnormal results.   Work note provided Patient should remain in quarantine until they have received Covid results.  If negative you may resume normal activities (go back to work/school) while practicing hand hygiene, social distance, and mask wearing.  If positive, patient should remain in quarantine for at least 5 days from symptom onset AND greater than 72 hours after symptoms resolution (absence of fever without the use of fever-reducing medication and improvement in respiratory symptoms), whichever is longer Get plenty of rest and push fluids Use OTC zyrtec for nasal congestion, runny nose, and/or sore throat Use OTC flonase for nasal congestion and runny nose Use medications daily for symptom relief Use OTC medications like ibuprofen or tylenol as needed fever or pain Call or go to the ED if you have any new or worsening symptoms such as fever, worsening cough, shortness of breath, chest tightness, chest pain, turning blue, changes in mental status.  Reviewed expectations re: course of  current medical issues. Questions answered. Outlined signs and symptoms indicating need for more acute intervention. Patient verbalized understanding. After Visit Summary given.         12/03/20, NP 10/05/20 1100

## 2020-10-05 NOTE — ED Triage Notes (Signed)
Was tested for covid yesterday and [facility], waiting on results

## 2020-10-05 NOTE — Discharge Instructions (Addendum)
Your rapid strep test is negative.  A throat culture is pending; we will call you if it is positive requiring treatment.    You should self quarantine until the test results are back.    Take Tylenol or ibuprofen as needed for fever or discomfort.  Rest and keep yourself hydrated.    Follow-up with your primary care provider if your symptoms are not improving.

## 2020-10-06 ENCOUNTER — Encounter (INDEPENDENT_AMBULATORY_CARE_PROVIDER_SITE_OTHER): Payer: Self-pay | Admitting: Internal Medicine

## 2020-10-06 ENCOUNTER — Telehealth (INDEPENDENT_AMBULATORY_CARE_PROVIDER_SITE_OTHER): Payer: BC Managed Care – PPO | Admitting: Internal Medicine

## 2020-10-06 ENCOUNTER — Ambulatory Visit (INDEPENDENT_AMBULATORY_CARE_PROVIDER_SITE_OTHER): Payer: BC Managed Care – PPO | Admitting: Internal Medicine

## 2020-10-06 VITALS — Temp 98.4°F | Ht 63.0 in

## 2020-10-06 DIAGNOSIS — R059 Cough, unspecified: Secondary | ICD-10-CM | POA: Diagnosis not present

## 2020-10-06 DIAGNOSIS — J40 Bronchitis, not specified as acute or chronic: Secondary | ICD-10-CM

## 2020-10-06 MED ORDER — AZITHROMYCIN 250 MG PO TABS: ORAL_TABLET | ORAL | 0 refills | Status: DC

## 2020-10-06 NOTE — Progress Notes (Signed)
Metrics: Intervention Frequency ACO  Documented Smoking Status Yearly  Screened one or more times in 24 months  Cessation Counseling or  Active cessation medication Past 24 months  Past 24 months   Guideline developer: UpToDate (See UpToDate for funding source) Date Released: 2014       Wellness Office Visit  Subjective:  Patient ID: Tonya Mclaughlin, female    DOB: 2001-05-15  Age: 20 y.o. MRN: [id number]  CC: This is an audio telemedicine visit with the permission of the patient who is home and I'm in my office. I used 2 identifiers to identify the patient. Productive cough, chest congestion. HPI  The patient has had above symptoms for the last 3 days but also includes headache and fevers. There was possible contact with COVID-19 at work. She has called health at work as she works for [employer] and PCR test is pending that was done apparently on Friday. Past Medical History:  Diagnosis Date  . ADHD (attention deficit hyperactivity disorder)   . Asthma   . Constipation   . Irregular bleeding 02/13/2015  . Migraine    History reviewed. No pertinent surgical history.   Family History  Problem Relation Age of Onset  . Hypertension Mother   . 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   Social History   Tobacco Use  . Smoking status: Never Smoker  . Smokeless tobacco: Never Used  Substance Use Topics  . Alcohol use: No    Current Meds  Medication Sig  . albuterol (PROVENTIL) (2.5 MG/3ML) 0.083% nebulizer solution Take 3 mLs (2.5 mg total) by nebulization every 6 (six) hours as needed for wheezing or shortness of breath.  . Albuterol Sulfate (PROAIR RESPICLICK) 108 (90 Base) MCG/ACT AEPB Inhale 1-2 puffs into the lungs every 6 (six) hours as needed.  Marland Kitchen azithromycin (ZITHROMAX) 250 MG tablet Take 2 tablets the first day and then 1  tablet every day for the next 4 days  . Cranberry 250 MG CAPS Take 1 capsule by mouth daily.  Marland Kitchen dicyclomine (BENTYL) 20 MG tablet as needed.  . medroxyPROGESTERone (DEPO-PROVERA) 150 MG/ML injection ADMINISTER 1 ML IN THE MUSCLE EVERY 3 MONTHS  . methocarbamol (ROBAXIN) 750 MG tablet Take 750 mg by mouth every 8 (eight) hours as needed. For back spasm .  Marland Kitchen NP THYROID 30 MG tablet Take 1 tablet (30 mg total) by mouth daily before breakfast.   Current Facility-Administered Medications for the 10/06/20 encounter (Video Visit) with Wilson Singer, MD  Medication  . medroxyPROGESTERone (DEPO-PROVERA) injection 150 mg      Depression screen [facility] 2/9 08/18/2020 10/04/2017 01/25/2017 12/14/2016 10/29/2016  Decreased Interest 1 0 0 1 0  Down, Depressed, Hopeless 3 0 0 1 0  PHQ - 2 Score 4 0 0 2 0  Altered sleeping 3 - 0 0 0  Tired, decreased energy 3 - 0 1 0  Change in appetite 2 - 0 2 0  Feeling bad or failure about yourself  0 - 0 0 0  Trouble concentrating 3 - 0 0 0  Moving slowly or fidgety/restless 1 - 0 0 0  Suicidal thoughts 0 - 0 0 0  PHQ-9 Score 16 - 0 5 0     Objective:   Today's Vitals: Temp 98.4 F (36.9 C) (Temporal)  Ht 5\' 3"  (1.6 m)   LMP 10/05/2020   BMI 25.69 kg/m  Vitals with BMI 10/06/2020 10/05/2020 08/18/2020  Height 5\' 3"  5\' 2"  5\' 3"   Weight (No Data) 145 lbs 147 lbs  BMI - 26.51 26.05  Systolic (No Data) 133 127  Diastolic (No Data) 86 70  Pulse (No Data) 97 78     Physical Exam  Virtual visit. She appears to be alert and orientated and not dyspneic at rest.     Assessment   1. Bronchitis   2. Cough       Tests ordered No orders of the defined types were placed in this encounter.    Plan: 1. I will treat her empirically for bronchitis although I told her that this could still be COVID-19 disease and also possibly influenza. Strep test was negative in the urgent care. 2. She will let 08/20/2020 know regarding her PCR test and of course health at work  will direct her as to when she can go back to work 3. This phone call lasted 6 minutes and 14 seconds.   Meds ordered this encounter  Medications  . azithromycin (ZITHROMAX) 250 MG tablet    Sig: Take 2 tablets the first day and then 1 tablet every day for the next 4 days    Dispense:  6 tablet    Refill:  0    Ger Nicks , MD

## 2020-10-08 LAB — CULTURE, GROUP A STREP (THRC)

## 2020-10-14 ENCOUNTER — Encounter (INDEPENDENT_AMBULATORY_CARE_PROVIDER_SITE_OTHER): Payer: Self-pay | Admitting: Nurse Practitioner

## 2020-10-14 ENCOUNTER — Ambulatory Visit (INDEPENDENT_AMBULATORY_CARE_PROVIDER_SITE_OTHER): Payer: BC Managed Care – PPO | Admitting: Nurse Practitioner

## 2020-10-14 ENCOUNTER — Other Ambulatory Visit: Payer: BC Managed Care – PPO | Admitting: Women's Health

## 2020-10-14 ENCOUNTER — Other Ambulatory Visit: Payer: Self-pay

## 2020-10-14 DIAGNOSIS — J4540 Moderate persistent asthma, uncomplicated: Secondary | ICD-10-CM | POA: Diagnosis not present

## 2020-10-14 MED ORDER — PREDNISONE 20 MG PO TABS: 40.0000 mg | ORAL_TABLET | Freq: Every day | ORAL | 0 refills | Status: DC

## 2020-10-14 MED ORDER — FLOVENT HFA 44 MCG/ACT IN AERO: 2.0000 | INHALATION_SPRAY | Freq: Two times a day (BID) | RESPIRATORY_TRACT | 12 refills | Status: DC

## 2020-10-14 NOTE — Progress Notes (Signed)
An audio-only tele-health visit was conducted today. I connected with  Tonya Mclaughlin on 10/14/20 utilizing audio-only technology and verified that I am speaking with the correct person using two identifiers. The patient was located at their home, and I was located at home during the encounter. I discussed the limitations of evaluation and management by telemedicine. The patient expressed understanding and agreed to proceed.   Subjective:  Patient ID: Tonya Mclaughlin, female    DOB: April 16, 2001  Age: 20 y.o. MRN: [id number]  CC:  Chief Complaint  Patient presents with  . Asthma      HPI  This patient arrives today for a virtual visit for the above.  She has a history of asthma and has been controlling it with as needed albuterol.  She was recently infected with COVID-19 infection and since then has been experiencing some increased shortness of breath with activity as well as dry cough.  She denies any significant episodes of bronchospasm.  Past Medical History:  Diagnosis Date  . ADHD (attention deficit hyperactivity disorder)   . Asthma   . Constipation   . Irregular bleeding 02/13/2015  . Migraine       Family History  Problem Relation Age of Onset  . Hypertension Mother   . 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   Social History   Tobacco Use  . Smoking status: Never Smoker  . Smokeless tobacco: Never Used  Substance Use Topics  . Alcohol use: No     No outpatient medications have been marked as taking for the 10/14/20 encounter (Office Visit) with Elenore Paddy, NP.   Current Facility-Administered Medications for the 10/14/20 encounter (Office Visit) with Elenore Paddy, NP  Medication  . medroxyPROGESTERone (DEPO-PROVERA) injection 150 mg    ROS:  Review of Systems  Respiratory: Positive for cough and shortness of  breath (with exercise). Negative for sputum production and wheezing.   Cardiovascular: Positive for chest pain (chest wall pain).     Objective:   Today's Vitals: LMP 10/05/2020  Vitals with BMI 10/14/2020 10/06/2020 10/05/2020  Height - 5\' 3"  5\' 2"   Weight - (No Data) 145 lbs  BMI - - 26.51  Systolic (No Data) (No Data) 133  Diastolic (No Data) (No Data) 86  Pulse - (No Data) 97     Physical Exam Comprehensive physical exam not completed today as office visit was conducted remotely.  Overall patient sounded well over the phone.  She did cough a few times during the visit but did not appear to sound extremely short of breath nor did she need to take breaks to breathe.  Patient was alert and oriented, and appeared to have appropriate judgment.       Assessment and Plan   1. Moderate persistent asthma without complication      Plan: 1.  We will prescribe her a course of prednisone to see if this can help with her cough and shortness of breath.  We will also start her on low-dose of Flovent.  I did explain that this is a maintenance inhaler and that she should take this every day whether she is having a good breathing day or bad breathing day.  She was also told to rinse her mouth out after using the Flovent.  We did discuss that  her albuterol inhaler or nebulizer treatment should still be used as a rescue if she is having a bronchospasm or wheezing episode.  She tells me she understands.  I also offered to refer her to the post COVID-19 clinic, but she tells me she would prefer to try the prednisone and Flovent in addition to her albuterol before going to the post-COVID clinic but would be open to this if her symptoms do not improve.  She tells me she will call me or call the office by Monday if symptoms persist or do not improve.   Tests ordered No orders of the defined types were placed in this encounter.     No orders of the defined types were placed in this  encounter.   Patient to follow-up in 2 weeks or sooner as needed.  Total time on telephone today was 10 minutes and 24 seconds.  Elenore Paddy, NP

## 2020-10-29 ENCOUNTER — Ambulatory Visit (INDEPENDENT_AMBULATORY_CARE_PROVIDER_SITE_OTHER): Payer: BC Managed Care – PPO

## 2020-10-31 ENCOUNTER — Other Ambulatory Visit: Payer: Self-pay | Admitting: Women's Health

## 2020-11-04 ENCOUNTER — Telehealth (INDEPENDENT_AMBULATORY_CARE_PROVIDER_SITE_OTHER): Payer: Self-pay | Admitting: Internal Medicine

## 2020-11-04 NOTE — Telephone Encounter (Signed)
Yes, she can take it with her breakfast as long as she takes it like this every morning.  Thanks.

## 2020-11-05 ENCOUNTER — Ambulatory Visit (INDEPENDENT_AMBULATORY_CARE_PROVIDER_SITE_OTHER): Payer: BC Managed Care – PPO

## 2020-11-05 ENCOUNTER — Ambulatory Visit (INDEPENDENT_AMBULATORY_CARE_PROVIDER_SITE_OTHER): Payer: Self-pay | Admitting: Internal Medicine

## 2020-11-05 DIAGNOSIS — Z23 Encounter for immunization: Secondary | ICD-10-CM | POA: Diagnosis not present

## 2020-11-06 ENCOUNTER — Other Ambulatory Visit: Payer: Self-pay

## 2020-11-06 NOTE — Progress Notes (Signed)
   Covid-19 Vaccination Clinic  Name:  Tonya Mclaughlin    MRN: [id number] DOB: [birth year]  11/06/2020  [name] was observed post Covid-19 immunization for 15 minutes Patient was symptomatically screen nothing about the medical history without incident. She was provided with Vaccine Information Sheet and instruction to access the V-Safe system.   [name] was instructed to call 911 with any severe reactions post vaccine: Marland Kitchen Difficulty breathing  . Swelling of face and throat  . A fast heartbeat  . A bad rash all over body  . Dizziness and weakness   Immunizations Administered    Name Date Dose VIS Date Route   PFIZER Comrnaty(Gray TOP) Covid-19 Vaccine 11/05/2020  8:25 PM 0.3 mL 09/04/2020 Intramuscular   Manufacturer: [employer], [employer]   Lot: [id number]   NDC: [phone]

## 2020-11-25 ENCOUNTER — Ambulatory Visit (INDEPENDENT_AMBULATORY_CARE_PROVIDER_SITE_OTHER): Payer: Self-pay | Admitting: Internal Medicine

## 2020-11-28 ENCOUNTER — Encounter: Payer: Self-pay | Admitting: Women's Health

## 2020-11-28 ENCOUNTER — Other Ambulatory Visit: Payer: Self-pay

## 2020-11-28 ENCOUNTER — Ambulatory Visit (INDEPENDENT_AMBULATORY_CARE_PROVIDER_SITE_OTHER): Payer: BC Managed Care – PPO | Admitting: Women's Health

## 2020-11-28 ENCOUNTER — Other Ambulatory Visit (HOSPITAL_COMMUNITY)
Admission: RE | Admit: 2020-11-28 | Discharge: 2020-11-28 | Disposition: A | Discharge status: Home / Self Care | Payer: BC Managed Care – PPO | Source: Ambulatory Visit | Attending: Obstetrics & Gynecology | Admitting: Obstetrics & Gynecology

## 2020-11-28 VITALS — BP 127/79 | HR 89 | Ht 62.0 in | Wt 151.2 lb

## 2020-11-28 DIAGNOSIS — Z01419 Encounter for gynecological examination (general) (routine) without abnormal findings: Secondary | ICD-10-CM | POA: Diagnosis not present

## 2020-11-28 DIAGNOSIS — N946 Dysmenorrhea, unspecified: Secondary | ICD-10-CM

## 2020-11-28 DIAGNOSIS — N941 Unspecified dyspareunia: Secondary | ICD-10-CM | POA: Diagnosis not present

## 2020-11-28 DIAGNOSIS — R102 Pelvic and perineal pain: Secondary | ICD-10-CM

## 2020-11-28 NOTE — Progress Notes (Signed)
WELL-WOMAN EXAMINATION Patient name: Tonya Mclaughlin MRN [id number]  Date of birth: 2001/02/27 Chief Complaint:   Gynecologic Exam  History of Present Illness:   Tonya Mclaughlin is a 20 y.o. G0P0000 Caucasian female being seen today for a routine well-woman exam.  Current complaints: bloating, pain w/ periods, RLQ pain w/ sex (not currently sexually active). Has IBS. Working w/ PCP to try to figure out etiology.  Depression screen [facility] 2/9 11/28/2020 08/18/2020 10/04/2017 01/25/2017 12/14/2016  Decreased Interest 2 1 0 0 1  Down, Depressed, Hopeless 1 3 0 0 1  PHQ - 2 Score 3 4 0 0 2  Altered sleeping 1 3 - 0 0  Tired, decreased energy 3 3 - 0 1  Change in appetite 2 2 - 0 2  Feeling bad or failure about yourself  1 0 - 0 0  Trouble concentrating 1 3 - 0 0  Moving slowly or fidgety/restless 0 1 - 0 0  Suicidal thoughts 0 0 - 0 0  PHQ-9 Score 11 16 - 0 5     PCP: Karilyn Cota      does not desire labs Patient's last menstrual period was 11/21/2020 (approximate). The current method of family planning is abstinence.  Last pap never. Results were: N/A. H/O abnormal pap: no Last mammogram: never. Results were: N/A. Family h/o breast cancer: no Last colonoscopy: never. Results were: N/A. Family h/o colorectal cancer: no Review of Systems:   Pertinent items are noted in HPI Denies any headaches, blurred vision, fatigue, shortness of breath, chest pain, abdominal pain, abnormal vaginal discharge/itching/odor/irritation, problems with periods, bowel movements, urination, or intercourse unless otherwise stated above. Pertinent History Reviewed:  Reviewed past medical,surgical, social and family history.  Reviewed problem list, medications and allergies. Physical Assessment:   Vitals:   11/28/20 1251  BP: 127/79  Pulse: 89  Weight: 151 lb 3.2 oz (68.6 kg)  Height: 5\' 2"  (1.575 m)  Body mass index is 27.65 kg/m.        Physical Examination:   General appearance - well appearing, and in no  distress  Mental status - alert, oriented to person, place, and time  Psych:  She has a normal mood and affect  Skin - warm and dry, normal color, no suspicious lesions noted  Chest - effort normal, all lung fields clear to auscultation bilaterally  Heart - normal rate and regular rhythm  Neck:  midline trachea, no thyromegaly or nodules  Breasts - breasts appear normal, no suspicious masses, no skin or nipple changes or  axillary nodes  Abdomen - soft, nontender, nondistended, no masses or organomegaly  Pelvic - VULVA: normal appearing vulva with no masses, tenderness or lesions  VAGINA: normal appearing vagina with normal color and discharge, no lesions  CERVIX: normal appearing cervix without discharge or lesions, no CMT  Thin prep pap is done w/ HR HPV cotesting  UTERUS: uterus is felt to be normal size, shape, consistency and nontender   ADNEXA: No adnexal masses or tenderness noted.  Extremities:  No swelling or varicosities noted  Chaperone: Angel Neas    No results found for this or any previous visit (from the past 24 hour(s)).  Assessment & Plan:  1) Well-Woman Exam  2) Bloating, dysmenorrhea, dyspareunia, RLQ pelvic pain> will get pelvic u/s and f/u.   Labs/procedures today: pap  Mammogram: @ 22yo, or sooner if problems Colonoscopy: @ 22yo, or sooner if problems  Orders Placed This Encounter  Procedures  . PELVIS (TRANSABDOMINAL ONLY)  .  US PELVIS TRANSVAGINAL NON-OB (TV ONLY)    Meds: No orders of the defined types were placed in this encounter.   Follow-up: Return for 1st available, US:GYN & f/u w/ me after.  Cheral Marker CNM, [facility] 11/28/2020 1:59 PM

## 2020-12-02 ENCOUNTER — Other Ambulatory Visit: Payer: Self-pay

## 2020-12-02 ENCOUNTER — Ambulatory Visit (INDEPENDENT_AMBULATORY_CARE_PROVIDER_SITE_OTHER): Payer: BC Managed Care – PPO | Admitting: Internal Medicine

## 2020-12-02 ENCOUNTER — Encounter (INDEPENDENT_AMBULATORY_CARE_PROVIDER_SITE_OTHER): Payer: Self-pay | Admitting: Internal Medicine

## 2020-12-02 VITALS — BP 122/80 | HR 77 | Temp 98.1°F | Ht 62.0 in | Wt 151.2 lb

## 2020-12-02 DIAGNOSIS — E559 Vitamin D deficiency, unspecified: Secondary | ICD-10-CM | POA: Diagnosis not present

## 2020-12-02 DIAGNOSIS — F988 Other specified behavioral and emotional disorders with onset usually occurring in childhood and adolescence: Secondary | ICD-10-CM | POA: Diagnosis not present

## 2020-12-02 DIAGNOSIS — R109 Unspecified abdominal pain: Secondary | ICD-10-CM

## 2020-12-02 DIAGNOSIS — R5383 Other fatigue: Secondary | ICD-10-CM | POA: Diagnosis not present

## 2020-12-02 LAB — CYTOLOGY - PAP: Diagnosis: NEGATIVE

## 2020-12-02 NOTE — Progress Notes (Signed)
Metrics: Intervention Frequency ACO  Documented Smoking Status Yearly  Screened one or more times in 24 months  Cessation Counseling or  Active cessation medication Past 24 months  Past 24 months   Guideline developer: UpToDate (See UpToDate for funding source) Date Released: 2014       Wellness Office Visit  Subjective:  Patient ID: Tonya Mclaughlin, female    DOB: December 24, 2000  Age: 20 y.o. MRN: [id number]  CC: This lady comes in for follow-up to discuss several symptoms that she is concerned about. HPI  She has seen her GYN and has been recommended to be treated with medication for her endometriosis. She also describes abdominal bloating and irregularity in her stools and abdominal cramping. Since she discontinued the Provera, the acne has improved and she wonders whether she should continue taking the NP thyroid. In the past, she has been diagnosed when she was a child with ADD and was treated with Vyvanse.  She wonders whether this would be helpful for her now in terms of her focus, and energy. Past Medical History:  Diagnosis Date  . ADHD (attention deficit hyperactivity disorder)   . Asthma   . Constipation   . Irregular bleeding 02/13/2015  . Migraine    History reviewed. No pertinent surgical history.   Family History  Problem Relation Age of Onset  . Hypertension Mother   . 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   Social History   Tobacco Use  . Smoking status: Never Smoker  . Smokeless tobacco: Never Used  Substance Use Topics  . Alcohol use: Yes    Comment: occ    Current Meds  Medication Sig  . albuterol (PROVENTIL) (2.5 MG/3ML) 0.083% nebulizer solution Take 3 mLs (2.5 mg total) by nebulization every 6 (six) hours as needed for wheezing or shortness of breath.  . Albuterol Sulfate (PROAIR RESPICLICK) 108 (90  Base) MCG/ACT AEPB Inhale 1-2 puffs into the lungs every 6 (six) hours as needed.  . Cranberry 250 MG CAPS Take 1 capsule by mouth daily.  Marland Kitchen dicyclomine (BENTYL) 20 MG tablet as needed.  . methocarbamol (ROBAXIN) 750 MG tablet Take 750 mg by mouth every 8 (eight) hours as needed. For back spasm .  Marland Kitchen Multiple Vitamins-Minerals (ONE-A-DAY WOMENS PO) Take 1 tablet by mouth.  . NP THYROID 30 MG tablet Take 1 tablet (30 mg total) by mouth daily before breakfast.   Current Facility-Administered Medications for the 12/02/20 encounter (Office Visit) with Wilson Singer, MD  Medication  . medroxyPROGESTERone (DEPO-PROVERA) injection 150 mg     Flowsheet Row Office Visit from 11/28/2020 in [facility]  PHQ-9 Total Score 11      Objective:   Today's Vitals: BP 122/80   Pulse 77   Temp 98.1 F (36.7 C) (Temporal)   Ht 5\' 2"  (1.575 m)   Wt 151 lb 3.2 oz (68.6 kg)   LMP 11/21/2020 (Approximate)   SpO2 94%   BMI 27.65 kg/m  Vitals with BMI 12/02/2020 11/28/2020 10/14/2020  Height 5\' 2"  5\' 2"  -  Weight 151 lbs 3 oz 151 lbs 3 oz -  BMI 27.65 27.65 -  Systolic 122 127 (No Data)  Diastolic 80 79 (No Data)  Pulse 77 89 -     Physical Exam  She looks systemically well.  No new physical findings.     Assessment   1. Fatigue, unspecified type   2. Vitamin D insufficiency   3. Attention deficit disorder, unspecified hyperactivity presence   4. Abdominal pain, unspecified abdominal location       Tests ordered Orders Placed This Encounter  Procedures  . Ambulatory referral to Psychiatry  . Ambulatory referral to Gastroenterology     Plan: 1. I recommended that she be seen by [place] attention specialists who are very good at treating and monitoring ADD.  She will call the office and we will make the referral also. 2. In terms of her endometriosis, I think it is reasonable to try Dewayne Hatch that has been recommended for the endometriosis. 3. As far as her abdominal  bloating/discomfort, I will refer her to Dr. Karilyn Cota for further opinion.  It is possible that we are dealing with irritable bowel syndrome.  I have reiterated that I believe that if she went on to a plant-based diet, some of her symptoms may improve.  I also was in favor of probiotics. 4. Follow-up in about 3 months.  I spent 30 minutes with this patient discussing all of the above and answering all her questions.   No orders of the defined types were placed in this encounter.   Wilson Singer, MD

## 2020-12-03 ENCOUNTER — Encounter (INDEPENDENT_AMBULATORY_CARE_PROVIDER_SITE_OTHER): Payer: Self-pay | Admitting: *Deleted

## 2020-12-18 ENCOUNTER — Telehealth: Payer: Self-pay | Admitting: Women's Health

## 2020-12-18 NOTE — Telephone Encounter (Signed)
Pt wonders if she should start the meds discussed at her last visit for the endometriosis or wait until after her U/S on 4/5  She asked for K.Booker to please advise

## 2020-12-23 ENCOUNTER — Other Ambulatory Visit (INDEPENDENT_AMBULATORY_CARE_PROVIDER_SITE_OTHER): Payer: BC Managed Care – PPO

## 2020-12-23 ENCOUNTER — Other Ambulatory Visit (HOSPITAL_COMMUNITY)
Admission: RE | Admit: 2020-12-23 | Discharge: 2020-12-23 | Disposition: A | Discharge status: Home / Self Care | Payer: BC Managed Care – PPO | Source: Ambulatory Visit | Attending: Obstetrics & Gynecology | Admitting: Obstetrics & Gynecology

## 2020-12-23 ENCOUNTER — Other Ambulatory Visit: Payer: Self-pay

## 2020-12-23 DIAGNOSIS — Z113 Encounter for screening for infections with a predominantly sexual mode of transmission: Secondary | ICD-10-CM | POA: Insufficient documentation

## 2020-12-23 NOTE — Progress Notes (Addendum)
   NURSE VISIT- STD  SUBJECTIVE:  Selma Mink is a 20 y.o. G0P0000 GYN patientfemale here for a vaginal swab for STD screen.  She reports the following symptoms: none Denies abnormal vaginal bleeding, significant pelvic pain, fever, or UTI symptoms.  OBJECTIVE:  There were no vitals taken for this visit.  Appears well, in no apparent distress  ASSESSMENT: Vaginal swab for STD screen  PLAN: Self-collected vaginal probe for Gonorrhea, Chlamydia, Trichomonas sent to lab Treatment: to be determined once results are received Follow-up as needed if symptoms persist/worsen, or new symptoms develop  Jobe Marker  12/23/2020 4:39 PM   Chart reviewed for nurse visit. Agree with plan of care.  Cheral Marker, [place] 12/23/2020 5:05 PM

## 2020-12-25 ENCOUNTER — Telehealth (INDEPENDENT_AMBULATORY_CARE_PROVIDER_SITE_OTHER): Payer: Self-pay

## 2020-12-25 LAB — CERVICOVAGINAL ANCILLARY ONLY
Chlamydia: NEGATIVE
Comment: NEGATIVE
Comment: NEGATIVE
Comment: NORMAL
Neisseria Gonorrhea: NEGATIVE
Trichomonas: NEGATIVE

## 2020-12-25 NOTE — Telephone Encounter (Signed)
Called patient and gave her the message from Maralyn Sago. Patient verbalized an understanding and stated that she is scheduled to see Dr. Loreta Ave at [facility] on Thursday, April 7th. Patient will also have them fax her office notes to [place].

## 2020-12-25 NOTE — Telephone Encounter (Signed)
It can be challenging to make a recommendation without evaluating the patient.  However, I recommend she try over-the-counter remedies such as Pepto-Bismol for abdominal upset and/or diarrhea.  If she is experiencing constipation she can try a mild stool softener such as docusate sodium.  If she tries these medication she should follow the label directions regarding dosing and frequency of use.  Of course if she is having severe constipation to the point where she has not had a bowel movement in a few days, is experiencing nausea and/or vomiting, and is not able to pass any gas this is much more significant and she should not take Pepto-Bismol or stool softener, but will need to proceed to the emergency department to have imaging completed to rule out bowel obstruction.  If she is experiencing severe diarrhea and abdominal cramping especially if she sees any blood in her stool she may consider going to an urgent care so her stool can be tested for C. Difficile infection.

## 2020-12-25 NOTE — Telephone Encounter (Signed)
Patient called and stated that she started having stomach issues on 12/11/2020 and started with some diarrhea, then she feels like she is constipated and not able to make a BM but she has still been able to go and stool is soft. Patient feels pressure in her stomach and she stated that her stomach hurts when she does not eat and then her stomach really hurts when she does eat food. Patient is asking if there is anything to help her feel a little better until she can see her GI doctor next week?  Please advise.

## 2020-12-30 ENCOUNTER — Encounter: Payer: Self-pay | Admitting: Women's Health

## 2020-12-30 ENCOUNTER — Ambulatory Visit (INDEPENDENT_AMBULATORY_CARE_PROVIDER_SITE_OTHER): Payer: BC Managed Care – PPO

## 2020-12-30 ENCOUNTER — Ambulatory Visit (INDEPENDENT_AMBULATORY_CARE_PROVIDER_SITE_OTHER): Payer: BC Managed Care – PPO | Admitting: Women's Health

## 2020-12-30 ENCOUNTER — Other Ambulatory Visit: Payer: Self-pay

## 2020-12-30 VITALS — BP 126/84 | HR 72 | Ht 62.0 in | Wt 147.5 lb

## 2020-12-30 DIAGNOSIS — Z3009 Encounter for other general counseling and advice on contraception: Secondary | ICD-10-CM

## 2020-12-30 DIAGNOSIS — R102 Pelvic and perineal pain: Secondary | ICD-10-CM | POA: Diagnosis not present

## 2020-12-30 DIAGNOSIS — N946 Dysmenorrhea, unspecified: Secondary | ICD-10-CM

## 2020-12-30 DIAGNOSIS — N941 Unspecified dyspareunia: Secondary | ICD-10-CM | POA: Diagnosis not present

## 2020-12-30 MED ORDER — ORILISSA 200 MG PO TABS: 200.0000 mg | ORAL_TABLET | Freq: Two times a day (BID) | ORAL | 5 refills | Status: DC

## 2020-12-30 NOTE — Progress Notes (Signed)
PELVIC US TA/TV:homogeneous retro/anteflexed uterus,wnl,EEC 4.3 mm,normal ovaries,ovaries appear mobile,small amount of simple cul de sac fluid,no pain during ultrasound   Chaperone Peggy

## 2020-12-30 NOTE — Progress Notes (Signed)
GYN VISIT Patient name: Tonya Mclaughlin MRN [id number]  Date of birth: [birth year] Chief Complaint:   discuss Tonya Mclaughlin results  History of Present Illness:   Tonya Mclaughlin is a 20 y.o. G0P0000 Caucasian female being seen today for f/u after pelvic u/s for bloating, dysmenorrhea, dyspareunia & RLQ pelvic pain. Had neg pap and neg gc/ct/trichomonas.  Interested in birth control , doesn't want depo- caused worsening depression and wt gain/acne in past. Liked not having a period.  Depression screen [facility] 2/9 11/28/2020 08/18/2020 10/04/2017 01/25/2017 12/14/2016  Decreased Interest 2 1 0 0 1  Down, Depressed, Hopeless 1 3 0 0 1  PHQ - 2 Score 3 4 0 0 2  Altered sleeping 1 3 - 0 0  Tired, decreased energy 3 3 - 0 1  Change in appetite 2 2 - 0 2  Feeling bad or failure about yourself  1 0 - 0 0  Trouble concentrating 1 3 - 0 0  Moving slowly or fidgety/restless 0 1 - 0 0  Suicidal thoughts 0 0 - 0 0  PHQ-9 Score 11 16 - 0 5    Patient's last menstrual period was 12/14/2020. The current method of family planning is abstinence and condoms.  Last pap 11/28/20. Results were: NILM w/ HRHPV not done Review of Systems:   Pertinent items are noted in HPI Denies fever/chills, dizziness, headaches, visual disturbances, fatigue, shortness of breath, chest pain, abdominal pain, vomiting, abnormal vaginal discharge/itching/odor/irritation, problems with periods, bowel movements, urination, or intercourse unless otherwise stated above.  Pertinent History Reviewed:  Reviewed past medical,surgical, social, obstetrical and family history.  Reviewed problem list, medications and allergies. Physical Assessment:   Vitals:   12/30/20 1407  BP: 126/84  Pulse: 72  Weight: 147 lb 8 oz (66.9 kg)  Height: 5\' 2"  (1.575 m)  Body mass index is 26.98 kg/m.       Physical Examination:   General appearance: alert, well appearing, and in no distress  Mental status: alert, oriented to person, place, and time  Skin: warm & dry    Cardiovascular: normal heart rate noted  Respiratory: normal respiratory effort, no distress  Abdomen: soft, non-tender   Pelvic: examination not indicated  Extremities: no edema   Today's pelvic u/s: Tonya Mclaughlin is a 20 y.o. G0P0000 LMP 12/16/2020 She is here for a pelvic sonogram for dysmenorrhea/RLQ pain.  Uterus                      6 x 3.4 x 4.6 cm, Total uterine volume 49 cc,homogeneous retro/anteflexed uterus,wnl Endometrium          4.3 mm, symmetrical, wnl Right ovary             2.5 x 2.1 x 2.8 cm, wnl Left ovary                3.3 x 2.3 x 3.1 cm, wnl small amount of simple cul de sac fluid  Technician Comments: PELVIC 12/18/2020 TA/TV:homogeneous retro/anteflexed uterus,wnl,EEC 4.3 mm,normal ovaries,ovaries appear mobile,small amount of simple cul de sac fluid,no pain during ultrasound  Chaperone Tonya Mclaughlin 12/30/2020 2:09 PM   Chaperone: n/a    No results found for this or any previous visit (from the past 24 hour(s)).  Assessment & Plan:  1) Bloating, dysmenorrhea, dyspareunia, RLQ pain> normal gyn u/s, discussed possibility of endometriosis, offered 03/01/2021, would like to try. Rx 200mg  BID x Tonya Mclaughlin, f/u in 6wks  2) Contraception counseling> discussed progestin-only  methods (d/t Tonya Mclaughlin), considering IUD, will let Tonya Mclaughlin know. Come on period or 14d from last sex if decides wants IUD.  Meds:  Meds ordered this encounter  Medications  . Elagolix Sodium (ORILISSA) 200 MG TABS    Sig: Take 200 mg by mouth in the morning and at bedtime.    Dispense:  60 tablet    Refill:  5    Order Specific Question:   Supervising Provider    Answer:   Despina Hidden, LUTHER H [[id number]]    No orders of the defined types were placed in this encounter.   Return in about 6 weeks (around 02/10/2021) for med f/u, in person, with me.  Cheral Marker CNM, [facility] 12/30/2020 2:32 PM

## 2020-12-31 ENCOUNTER — Telehealth: Payer: Self-pay | Admitting: *Deleted

## 2020-12-31 NOTE — Telephone Encounter (Signed)
Orilissa 200 mg has been approved. Pharmacy and pt aware. JSY

## 2021-01-01 LAB — HEPATIC FUNCTION PANEL
ALT: 17 (ref 7–35)
AST: 19 (ref 13–35)
Alkaline Phosphatase: 49 (ref 25–125)
Bilirubin, Direct: 0.2
Bilirubin, Total: 0.7

## 2021-01-01 LAB — BASIC METABOLIC PANEL
BUN: 15 (ref 4–21)
CO2: 23 — AB (ref 13–22)
Chloride: 104 (ref 99–108)
Creatinine: 0.7 (ref <=1.1)
Glucose: 76
Potassium: 4.1 (ref 3.4–5.3)
Sodium: 140 (ref 137–147)

## 2021-01-01 LAB — COMPREHENSIVE METABOLIC PANEL
Albumin: 4.5 (ref 3.5–5.0)
Calcium: 9.3 (ref 8.7–10.7)

## 2021-01-01 LAB — TSH: TSH: 1.29 (ref <=5.90)

## 2021-01-01 LAB — CBC: RBC: 4.05 (ref 3.87–5.11)

## 2021-01-01 LAB — CBC AND DIFFERENTIAL
HCT: 39 (ref 36–46)
Hemoglobin: 12.9 (ref 12.0–16.0)
Platelets: 280 (ref 150–399)
WBC: 5.9

## 2021-01-05 ENCOUNTER — Encounter (INDEPENDENT_AMBULATORY_CARE_PROVIDER_SITE_OTHER): Payer: Self-pay

## 2021-01-29 ENCOUNTER — Encounter (INDEPENDENT_AMBULATORY_CARE_PROVIDER_SITE_OTHER): Payer: Self-pay | Admitting: Nurse Practitioner

## 2021-01-29 ENCOUNTER — Other Ambulatory Visit: Payer: Self-pay

## 2021-01-29 ENCOUNTER — Ambulatory Visit (INDEPENDENT_AMBULATORY_CARE_PROVIDER_SITE_OTHER): Payer: BC Managed Care – PPO | Admitting: Nurse Practitioner

## 2021-01-29 VITALS — BP 121/70 | HR 88 | Temp 97.7°F | Resp 18 | Wt 149.0 lb

## 2021-01-29 DIAGNOSIS — T148XXA Other injury of unspecified body region, initial encounter: Secondary | ICD-10-CM | POA: Diagnosis not present

## 2021-01-29 NOTE — Progress Notes (Signed)
Subjective:  Patient ID: Tonya Mclaughlin, female    DOB: Jun 22, 2001  Age: 20 y.o. MRN: [id number]  CC:  Chief Complaint  Patient presents with  . Follow-up    Feeling a catch in rib on the left side. More muscular. And feeling some congestion too.       HPI  This patient arrives today for an acute visit for the above.  She tells me that this morning she started experiencing some left sided rib pain, she describes it as almost being located in between the ribs.  Its located under her left breast.  She notices it with certain twisting motions, laughing, and deep breaths.  She did take her albuterol inhaler which seemed to improve the pain.  But she has not been experiencing significant shortness of breath or wheezing.  She tells me that she did work out her arms and back yesterday.  She tells me the intensity of pain is up to 5 out of 10, but when she does not make any eliciting movement she does not have any pain.  She denies any worsening or triggering of the pain with physical exertion.  She has tried a robaxin and did not see much improvement in the pain.  She tells me that as the morning and day has gone on the pain in general seems to have improved and she can move a bit more without causing the pain.  She denies any traumatic events or accidents in her recent past that could explain the pain.  Past Medical History:  Diagnosis Date  . ADHD (attention deficit hyperactivity disorder)   . Asthma   . Constipation   . Irregular bleeding 02/13/2015  . Migraine       Family History  Problem Relation Age of Onset  . Hypertension Mother   . 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   Social History   Tobacco Use  . Smoking status: Never Smoker  . Smokeless tobacco: Never Used  Substance Use Topics  . Alcohol use: Yes    Comment: occ      Current Meds  Medication Sig  . albuterol (PROVENTIL) (2.5 MG/3ML) 0.083% nebulizer solution Take 3 mLs (2.5 mg total) by nebulization every 6 (six) hours as needed for wheezing or shortness of breath.  . Albuterol Sulfate (PROAIR RESPICLICK) 108 (90 Base) MCG/ACT AEPB Inhale 1-2 puffs into the lungs every 6 (six) hours as needed.  . Cranberry 250 MG CAPS Take 1 capsule by mouth daily.  Marland Kitchen dicyclomine (BENTYL) 20 MG tablet as needed.  . Elagolix Sodium (ORILISSA) 200 MG TABS Take 200 mg by mouth in the morning and at bedtime.  . methocarbamol (ROBAXIN) 750 MG tablet Take 750 mg by mouth every 8 (eight) hours as needed. For back spasm .  Marland Kitchen Multiple Vitamins-Minerals (ONE-A-DAY WOMENS PO) Take 1 tablet by mouth.    ROS:  Review of Systems  HENT: Positive for congestion.   Respiratory: Negative for cough, shortness of breath and wheezing.   Cardiovascular: Negative for chest pain (rib pain) and palpitations.  Gastrointestinal: Positive for constipation. Negative for abdominal pain.  Musculoskeletal: Positive for myalgias.     Objective:   Today's Vitals: BP 121/70 (BP Location: Right Arm, Patient Position: Sitting, Cuff Size: Normal)   Pulse 88  Temp 97.7 F (36.5 C) (Temporal)   Resp 18   Wt 149 lb (67.6 kg)   SpO2 99%   BMI 27.25 kg/m  Vitals with BMI 01/29/2021 12/30/2020 12/02/2020  Height - 5\' 2"  5\' 2"   Weight 149 lbs 147 lbs 8 oz 151 lbs 3 oz  BMI - 26.97 27.65  Systolic 121 126  Diastolic 70 84 80  Pulse 88 72 77     Physical Exam Vitals reviewed.  Constitutional:      General: She is not in acute distress.    Appearance: Normal appearance.  HENT:     Head: Normocephalic and atraumatic.  Neck:     Vascular: No carotid bruit.  Cardiovascular:     Rate and Rhythm: Normal rate and regular rhythm.     Pulses: Normal pulses.     Heart sounds: Normal heart sounds.  Pulmonary:     Effort: Pulmonary effort is normal.     Breath sounds: Normal breath sounds.   Chest:     Chest wall: No mass or tenderness.  Abdominal:     General: Abdomen is flat. Bowel sounds are normal.     Palpations: Abdomen is soft.     Tenderness: There is no abdominal tenderness.  Skin:    General: Skin is warm and dry.  Neurological:     General: No focal deficit present.     Mental Status: She is alert and oriented to person, place, and time.  Psychiatric:        Mood and Affect: Mood normal.        Behavior: Behavior normal.        Judgment: Judgment normal.          Assessment and Plan   1. Muscle strain      Plan: 1.  I think more than likely this represents muscle strain that may be occurred when she was working out yesterday.  I recommended that she try taking ibuprofen in addition to her muscle relaxer later today to see if this helps the pain even further.  We did discuss red flag symptoms of acute coronary syndrome and if these were to occur she is to proceed to the emergency department.  She has very few risk factors for cardiovascular disease, no exertional worsening of pain, and because her pain seems to be only elicited with certain motions (plus it has improved as the day has worn on), I did not see a reason to get EKG today.  However, I did tell her if the pain persists into next week or if it worsens over the weekend she needs to either proceed to the emergency department or call early next week so we can discuss further evaluation.  She tells me she understands.   Tests ordered No orders of the defined types were placed in this encounter.     No orders of the defined types were placed in this encounter.   Patient to follow-up as scheduled next month or sooner as needed.  951, NP

## 2021-02-10 ENCOUNTER — Ambulatory Visit: Payer: BC Managed Care – PPO | Admitting: Women's Health

## 2021-02-17 ENCOUNTER — Other Ambulatory Visit: Payer: Self-pay

## 2021-02-17 ENCOUNTER — Telehealth (INDEPENDENT_AMBULATORY_CARE_PROVIDER_SITE_OTHER): Payer: Self-pay | Admitting: Internal Medicine

## 2021-02-17 ENCOUNTER — Ambulatory Visit (INDEPENDENT_AMBULATORY_CARE_PROVIDER_SITE_OTHER): Payer: BC Managed Care – PPO | Admitting: Internal Medicine

## 2021-02-17 ENCOUNTER — Ambulatory Visit
Admission: EM | Admit: 2021-02-17 | Discharge: 2021-02-17 | Disposition: A | Discharge status: Home / Self Care | Payer: BC Managed Care – PPO | Attending: Family Medicine | Admitting: Family Medicine

## 2021-02-17 DIAGNOSIS — J0140 Acute pansinusitis, unspecified: Secondary | ICD-10-CM | POA: Diagnosis not present

## 2021-02-17 MED ORDER — CEFDINIR 300 MG PO CAPS: 300.0000 mg | ORAL_CAPSULE | Freq: Two times a day (BID) | ORAL | 0 refills | Status: AC

## 2021-02-17 MED ORDER — BENZONATATE 100 MG PO CAPS: 100.0000 mg | ORAL_CAPSULE | Freq: Three times a day (TID) | ORAL | 0 refills | Status: DC

## 2021-02-17 NOTE — ED Triage Notes (Signed)
Pt reports cough, bilateral ear pain, sore throat and nasal congestion x 6 days. DayQulio and Nyquil gives no relief. Denies fever.   Reports she had a negative COVID and Flu test 4 days ago at the [facility].

## 2021-02-17 NOTE — ED Provider Notes (Signed)
[facility]    CSN: [id number] Arrival date & time: 02/17/21  0917      History   Chief Complaint Chief Complaint  Patient presents with  . Nasal Congestion    Waiting in Car [phone]  . Cough  . Otalgia  . Sore Throat    HPI Tonya Mclaughlin is a 20 y.o. female.   Reports cough, bilateral ear pain, sore throat and nasal congestion for the last week.  States that she has been taking DayQuil and NyQuil with no relief.  States that she was seen at another urgent care and had a COVID and flu test 4 days ago that were both negative.  Denies known sick contacts.  Has negative history of COVID.  Has completed COVID-vaccine and booster.  Has completed flu vaccine.  Denies abdominal pain, nausea, vomiting, diarrhea, rash, fever, other symptoms.  ROS per HPI   The history is provided by the patient.  Cough Associated symptoms: ear pain   Otalgia Associated symptoms: cough   Sore Throat    Past Medical History:  Diagnosis Date  . ADHD (attention deficit hyperactivity disorder)   . Asthma   . Constipation   . Irregular bleeding 02/13/2015  . Migraine     Patient Active Problem List   Diagnosis Date Noted  . Irritable bowel syndrome with constipation 12/14/2016  . Abdominal pain, left lower quadrant 09/17/2016  . Irregular bleeding 02/13/2015  . Migraine headache 05/08/2014  . 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 No pertinent surgical history.  OB History    Gravida  0   Para  0   Term  0   Preterm  0   AB  0   Living  0     SAB  0   IAB  0   Ectopic  0   Multiple  0   Live Births               Home Medications    Prior to Admission medications   Medication Sig Start Date End Date Taking? Authorizing Provider  benzonatate (TESSALON) 100 MG capsule Take 1 capsule (100 mg total) by mouth every 8 (eight) hours. 02/17/21  Yes Moshe Cipro, NP  cefdinir (OMNICEF) 300  MG capsule Take 1 capsule (300 mg total) by mouth 2 (two) times daily for 7 days. 02/17/21 02/24/21 Yes Moshe Cipro, NP  albuterol (PROVENTIL) (2.5 MG/3ML) 0.083% nebulizer solution Take 3 mLs (2.5 mg total) by nebulization every 6 (six) hours as needed for wheezing or shortness of breath. 07/30/20   Elenore Paddy, NP  Albuterol Sulfate (PROAIR RESPICLICK) 108 (90 Base) MCG/ACT AEPB Inhale 1-2 puffs into the lungs every 6 (six) hours as needed. 07/30/20   Elenore Paddy, NP  Cranberry 250 MG CAPS Take 1 capsule by mouth daily.    [provider]  dicyclomine (BENTYL) 20 MG tablet as needed. 01/23/20   [provider]  Elagolix Sodium (ORILISSA) 200 MG TABS Take 200 mg by mouth in the morning and at bedtime. 12/30/20   Cheral Marker, CNM  methocarbamol (ROBAXIN) 750 MG tablet Take 750 mg by mouth every 8 (eight) hours as needed. For back spasm . 05/20/20   [provider]  Multiple Vitamins-Minerals (ONE-A-DAY WOMENS PO) Take 1 tablet by mouth.    [provider]    Family History Family History  Problem Relation Age of Onset  . Hypertension Mother   . Migraines  Mother     Social History Social History   Tobacco Use  . Smoking status: Never Smoker  . Smokeless tobacco: Never Used  Vaping Use  . Vaping Use: Never used  Substance Use Topics  . Alcohol use: Yes    Comment: occ  . Drug use: No     Allergies   Penicillins   Review of Systems Review of Systems  HENT: Positive for ear pain.   Respiratory: Positive for cough.      Physical Exam Triage Vital Signs ED Triage Vitals  Enc Vitals Group     BP 02/17/21 1102 127/89     Pulse Rate 02/17/21 1102 80     Resp 02/17/21 1102 18     Temp 02/17/21 1102 98.8 F (37.1 C)     Temp Source 02/17/21 1102 Oral     SpO2 02/17/21 1102 98 %     Weight --      Height --      Head Circumference --      Peak Flow --      Pain Score 02/17/21 1100 6     Pain Loc --      Pain Edu? --       Excl. in GC? --    No data found.  Updated Vital Signs BP 127/89 (BP Location: Right Arm)   Pulse 80   Temp 98.8 F (37.1 C) (Oral)   Resp 18   LMP  (Within Weeks) Comment: 1 week   SpO2 98%   Visual Acuity Right Eye Distance:   Left Eye Distance:   Bilateral Distance:    Right Eye Near:   Left Eye Near:    Bilateral Near:     Physical Exam Vitals and nursing note reviewed.  Constitutional:      General: She is not in acute distress.    Appearance: Normal appearance. She is well-developed and normal weight.  HENT:     Head: Normocephalic and atraumatic.     Right Ear: Tympanic membrane, ear canal and external ear normal.     Left Ear: Tympanic membrane, ear canal and external ear normal.     Nose: Congestion present.     Comments: Frontal and maxillary sinus tenderness    Mouth/Throat:     Mouth: Mucous membranes are moist.     Pharynx: Posterior oropharyngeal erythema present.  Eyes:     Extraocular Movements: Extraocular movements intact.     Conjunctiva/sclera: Conjunctivae normal.     Pupils: Pupils are equal, round, and reactive to light.  Cardiovascular:     Rate and Rhythm: Normal rate and regular rhythm.     Heart sounds: Normal heart sounds. No murmur heard.   Pulmonary:     Effort: Pulmonary effort is normal. No respiratory distress.     Breath sounds: Normal breath sounds. No stridor. No wheezing, rhonchi or rales.  Chest:     Chest wall: No tenderness.  Musculoskeletal:        General: Normal range of motion.     Cervical back: Normal range of motion and neck supple.  Lymphadenopathy:     Cervical: Cervical adenopathy present.  Skin:    General: Skin is warm and dry.     Capillary Refill: Capillary refill takes less than 2 seconds.  Neurological:     General: No focal deficit present.     Mental Status: She is alert and oriented to person, place, and time.  Psychiatric:  Mood and Affect: Mood normal.        Behavior: Behavior normal.         Thought Content: Thought content normal.      UC Treatments / Results  Labs (all labs ordered are listed, but only abnormal results are displayed) Labs Reviewed - No data to display  EKG   Radiology No results found.  Procedures Procedures (including critical care time)  Medications Ordered in UC Medications - No data to display  Initial Impression / Assessment and Plan / UC Course  I have reviewed the triage vital signs and the nursing notes.  Pertinent labs & imaging results that were available during my care of the patient were reviewed by me and considered in my medical decision making (see chart for details).    Acute pansinusitis  Cefdinir 300 mg twice daily x7 days prescribed Tessalon Perles prescribed as needed cough May take ibuprofen and Tylenol as needed Push fluids and get plenty of rest  Declines COVID and flu testing today Follow up with this office or with primary care if symptoms are persisting.  Follow up in the ER for high fever, trouble swallowing, trouble breathing, other concerning symptoms.   Final Clinical Impressions(s) / UC Diagnoses   Final diagnoses:  Acute non-recurrent pansinusitis     Discharge Instructions     I have sent in Cefdinir for you to take twice a day for 7 days.  I have sent in tessalon perles for you to use one capsule every 8 hours as needed for cough.  Follow up with this office or with primary care if symptoms are persisting.  Follow up in the ER for high fever, trouble swallowing, trouble breathing, other concerning symptoms.     ED Prescriptions    Medication Sig Dispense Auth. Provider   cefdinir (OMNICEF) 300 MG capsule Take 1 capsule (300 mg total) by mouth 2 (two) times daily for 7 days. 14 capsule Moshe Cipro, NP   benzonatate (TESSALON) 100 MG capsule Take 1 capsule (100 mg total) by mouth every 8 (eight) hours. 21 capsule Moshe Cipro, NP     PDMP not reviewed this encounter.    Moshe Cipro, NP 02/22/21 [phone]

## 2021-02-17 NOTE — Telephone Encounter (Signed)
Please schedule her an appointment with either me or Maralyn Sago today.  Thanks.

## 2021-02-17 NOTE — Discharge Instructions (Addendum)
I have sent in Cefdinir for you to take twice a day for 7 days.  I have sent in tessalon perles for you to use one capsule every 8 hours as needed for cough.  Follow up with this office or with primary care if symptoms are persisting.  Follow up in the ER for high fever, trouble swallowing, trouble breathing, other concerning symptoms.  

## 2021-02-25 ENCOUNTER — Other Ambulatory Visit (INDEPENDENT_AMBULATORY_CARE_PROVIDER_SITE_OTHER): Payer: Self-pay | Admitting: Internal Medicine

## 2021-02-25 ENCOUNTER — Telehealth (INDEPENDENT_AMBULATORY_CARE_PROVIDER_SITE_OTHER): Payer: Self-pay

## 2021-02-25 MED ORDER — PREDNISONE 20 MG PO TABS
40.0000 mg | ORAL_TABLET | Freq: Every day | ORAL | 1 refills | Status: DC
Start: 2021-02-25 — End: 2021-07-21

## 2021-02-25 NOTE — Telephone Encounter (Signed)
Tell the patient I have sent prednisone to the [facility] pharmacy on [address].  Take 2 tablets every morning for 5 days.  Make sure she asked the pharmacy regarding possible side effects which include jitteriness, increased appetite, poor sleep, increased energy.  If she gets any of the side effects, go down to 1 tablet daily and finish the course. If she does not improve after all of this, call next week for an in office appointment

## 2021-02-25 NOTE — Telephone Encounter (Signed)
Called the pt phone back. But pt is not able to leave message. Will send a via mychart message to detail.

## 2021-02-25 NOTE — Telephone Encounter (Signed)
Still sick [facility]. Want to know should he come in or give  her a tele med visit & maybe a steroid? Please advise.

## 2021-02-26 NOTE — Telephone Encounter (Signed)
Pt was called and given verbal instructions to take the steroid medication. Pt said she did pick up last night. Started  taking as directed, but if it is too much she will reduce to one tablet a day.

## 2021-03-02 ENCOUNTER — Other Ambulatory Visit: Payer: Self-pay

## 2021-03-02 ENCOUNTER — Encounter (INDEPENDENT_AMBULATORY_CARE_PROVIDER_SITE_OTHER): Payer: Self-pay | Admitting: Gastroenterology

## 2021-03-02 ENCOUNTER — Ambulatory Visit (INDEPENDENT_AMBULATORY_CARE_PROVIDER_SITE_OTHER): Payer: BC Managed Care – PPO | Admitting: Gastroenterology

## 2021-03-02 VITALS — BP 138/83 | HR 79 | Temp 98.8°F | Ht 62.0 in | Wt 147.0 lb

## 2021-03-02 DIAGNOSIS — K581 Irritable bowel syndrome with constipation: Secondary | ICD-10-CM | POA: Diagnosis not present

## 2021-03-02 MED ORDER — DICYCLOMINE HCL 10 MG PO CAPS: 10.0000 mg | ORAL_CAPSULE | Freq: Two times a day (BID) | ORAL | 2 refills | Status: DC | PRN

## 2021-03-02 NOTE — Progress Notes (Signed)
Katrinka Blazing, M.D. Gastroenterology & Hepatology [facility] [address] [place], [place] [address] Primary Care Physician: Wilson Singer, MD [address] [place] [address]  Referring MD: PCP  Chief Complaint: Abdominal pain and changes in bowel movements  History of Present Illness: Tonya Mclaughlin is a 20 y.o. female with past medical history of asthma, ADHD, who presents for evaluation of abdominal pain and changes in bowel movements.  Patient reports that she has presented chronic abdominal pain in her LUQ since she was a child.  However she notes that the pain worsened since 2016 after she had a "GI infection".  States that the pain has been more frequent and severe in nature.  She reports that her abdominal pain is a cramping pain that is very painful, which occasionally radiates to the RUQ. States that when she is stressed or when she eats certain types of food (dairy and greasy food) her symptoms get worse. Also reports intermittent episodes of constipation abdominal with some episodes of soft bowel movements. She has constant bloating, almost every day which causes significant discomfort -states that the bloating mostly located in lower abdomen. The bloating gets better when she moves her bowels.  However, when she is constipated her bloating is significant.  Patient states she tried Amitiza, Linzess and Miralax at the same time when she had her gastrointestinal infection in 2016.  However she did not keep taking any of these medications as it led to significant diarrhea.  She stated that Linzess was prescribed at the highest dose that large amount of bowel movements.  As part of the investigations of her chronic symptoms, she stated that she was evaluated by gastroenterology who performed an EGD with findings described below.  She was told that her symptoms were likely related to IBS. She has tried dicyclomine in the past for  nausea and abdominal pain, but uses most frequently the heating pads.  She believes that the dicyclomine has helped for her pain in the past.  She tries to exercise often.   The patient denies having any nausea, vomiting, fever, chills, hematochezia, melena, hematemesis, jaundice, pruritus or weight loss.  Last EGD:3 years ago per patient, normal but no report is available Last Colonoscopy:never  FHx: had cousins with history of colitis, otherwise neg for any gastrointestinal/liver disease, no malignancies Social: neg smoking, alcohol or illicit drug use Surgical: no abdominal surgeries  Past Medical History: Past Medical History:  Diagnosis Date  . ADHD (attention deficit hyperactivity disorder)   . Asthma   . Constipation   . Irregular bleeding 02/13/2015  . Migraine     Past Surgical History:History reviewed. No pertinent surgical history.  Family History: Family History  Problem Relation Age of Onset  . Hypertension Mother   . Migraines Mother     Social History: Social History   Tobacco Use  Smoking Status Never Smoker  Smokeless Tobacco Never Used   Social History   Substance and Sexual Activity  Alcohol Use Yes   Comment: occ   Social History   Substance and Sexual Activity  Drug Use No    Allergies: Allergies  Allergen Reactions  . Penicillins     Doesn't work for her    Medications: Current Outpatient Medications  Medication Sig Dispense Refill  . albuterol (PROVENTIL) (2.5 MG/3ML) 0.083% nebulizer solution Take 3 mLs (2.5 mg total) by nebulization every 6 (six) hours as needed for wheezing or shortness of breath. 150 mL 1  .  Albuterol Sulfate (PROAIR RESPICLICK) 108 (90 Base) MCG/ACT AEPB Inhale 1-2 puffs into the lungs every 6 (six) hours as needed. 1 each 3  . Cranberry 250 MG CAPS Take 1 capsule by mouth daily.    Marland Kitchen dicyclomine (BENTYL) 20 MG tablet as needed.    . methocarbamol (ROBAXIN) 750 MG tablet Take 750 mg by mouth every 8 (eight)  hours as needed. For back spasm .    Marland Kitchen Multiple Vitamins-Minerals (ONE-A-DAY WOMENS PO) Take 1 tablet by mouth.    . predniSONE (DELTASONE) 20 MG tablet Take 2 tablets (40 mg total) by mouth daily with breakfast. 10 tablet 1   No current facility-administered medications for this visit.    Review of Systems: GENERAL: negative for malaise, night sweats HEENT: No changes in hearing or vision, no nose bleeds or other nasal problems. NECK: Negative for lumps, goiter, pain and significant neck swelling RESPIRATORY: Negative for cough, wheezing CARDIOVASCULAR: Negative for chest pain, leg swelling, palpitations, orthopnea GI: SEE HPI MUSCULOSKELETAL: Negative for joint pain or swelling, back pain, and muscle pain. SKIN: Negative for lesions, rash PSYCH: Negative for sleep disturbance, mood disorder and recent psychosocial stressors. HEMATOLOGY Negative for prolonged bleeding, bruising easily, and swollen nodes. ENDOCRINE: Negative for cold or heat intolerance, polyuria, polydipsia and goiter. NEURO: negative for tremor, gait imbalance, syncope and seizures. The remainder of the review of systems is noncontributory.   Physical Exam: BP 138/83 (BP Location: Left Arm, Patient Position: Sitting, Cuff Size: Small)   Pulse 79   Temp 98.8 F (37.1 C) (Oral)   Ht 5\' 2"  (1.575 m)   Wt 147 lb (66.7 kg)   BMI 26.89 kg/m  GENERAL: The patient is AO x3, in no acute distress. HEENT: Head is normocephalic and atraumatic. EOMI are intact. Mouth is well hydrated and without lesions. NECK: Supple. No masses LUNGS: Clear to auscultation. No presence of rhonchi/wheezing/rales. Adequate chest expansion HEART: RRR, normal s1 and s2. ABDOMEN: Soft, nontender, no guarding, no peritoneal signs, and nondistended. BS +. No masses. EXTREMITIES: Without any cyanosis, clubbing, rash, lesions or edema. NEUROLOGIC: AOx3, no focal motor deficit. SKIN: no jaundice, no rashes   Imaging/Labs: as above  I  personally reviewed and interpreted the available labs, imaging and endoscopic files.  Impression and Plan: Adin Lariccia is a 20 y.o. female with past medical history of asthma, ADHD, who presents for evaluation of abdominal pain and changes in bowel movements.  The patient has presented chronic symptoms without presence of red flag signs.  I believe her symptoms are likely related to a combination of irritable bowel syndrome and possible lactose intolerance.  Due to this, I advised patient to implement a low FODMAP diet with the aim to improve her symptoms much more.  We had a thorough discussion regarding the pathophysiology of irritable bowel syndrome and the potential treatments available.  We will check for celiac disease today with serology.  I advised the patient to take peppermint as needed to improve her symptoms, but I also sent a prescription of Bentyl if her symptoms do not respond to these over-the-counter medication.  She will also try taking probiotics for a month and will assess her symptom improvement.  - Explained presumed etiology of IBS symptoms. Patient was counseled about the benefit of implementing a low FODMAP to improve symptoms and recurrent episodes. A dietary list was provided to the patient. Also, the patient was counseled about the benefit of avoiding stressing situations and potential environmental triggers leading to symptomatology. - Can  try probiotics daily - Celiac panel - Start IBGard/peppermint oil 1 tablet every 8-12 hours as needed - If persistent pain/bloating, can try Bentyl as needed  All questions were answered.      Katrinka Blazing, MD Gastroenterology and Hepatology [facility]

## 2021-03-02 NOTE — Patient Instructions (Addendum)
Explained presumed etiology of IBS symptoms. Patient was counseled about the benefit of implementing a low FODMAP to improve symptoms and recurrent episodes. A dietary list was provided to the patient. Also, the patient was counseled about the benefit of avoiding stressing situations and potential environmental triggers leading to symptomatology. Can try probiotics daily Perform blood workup Start IBGard/peppermint oil 1 tablet every 8-12 hours as needed If persistent pain/bloating, can try Bentyl as needed

## 2021-03-03 LAB — CELIAC DISEASE PANEL
(tTG) Ab, IgA: <1 U/mL
(tTG) Ab, IgG: <1 U/mL
Gliadin IgA: <1 U/mL
Gliadin IgG: <1 U/mL
Immunoglobulin A: 229 mg/dL (ref 47–310)

## 2021-03-09 ENCOUNTER — Ambulatory Visit (INDEPENDENT_AMBULATORY_CARE_PROVIDER_SITE_OTHER): Payer: BC Managed Care – PPO | Admitting: Internal Medicine

## 2021-03-09 ENCOUNTER — Other Ambulatory Visit (INDEPENDENT_AMBULATORY_CARE_PROVIDER_SITE_OTHER): Payer: Self-pay | Admitting: Internal Medicine

## 2021-04-08 ENCOUNTER — Telehealth (INDEPENDENT_AMBULATORY_CARE_PROVIDER_SITE_OTHER): Payer: Self-pay

## 2021-04-08 NOTE — Telephone Encounter (Signed)
Please make her an acute visit with Maralyn Sago tomorrow.  Thanks.

## 2021-04-08 NOTE — Telephone Encounter (Signed)
Patient called and stated that her tonsils and lymph nodes are swollen on both sides and her Left side is definitely worse and she took the last Prednisone dose pack and finished 3 days ago and helped some but not like it has before. Swelling has been going on for 1 week and very swollen and denies any white spots in back of throat or any throat soreness just uncomfortable from the swelling. Since we do not have an opening in our office today patient is asking if she needs to take more Prednisone? Please advise.

## 2021-04-08 NOTE — Telephone Encounter (Signed)
Okay, in that case, make her an appointment to see me tomorrow afternoon.

## 2021-04-08 NOTE — Telephone Encounter (Signed)
Called patient and scheduled her for 4:20pm tomorrow with Dr. Karilyn Cota. Patient verbalized an understanding.

## 2021-04-08 NOTE — Telephone Encounter (Signed)
Tonya Mclaughlin is not here tomorrow.  Please advise.

## 2021-04-09 ENCOUNTER — Encounter (INDEPENDENT_AMBULATORY_CARE_PROVIDER_SITE_OTHER): Payer: Self-pay | Admitting: Internal Medicine

## 2021-04-09 ENCOUNTER — Other Ambulatory Visit: Payer: Self-pay

## 2021-04-09 ENCOUNTER — Ambulatory Visit (INDEPENDENT_AMBULATORY_CARE_PROVIDER_SITE_OTHER): Payer: BC Managed Care – PPO | Admitting: Internal Medicine

## 2021-04-09 VITALS — BP 118/70 | HR 86 | Temp 97.8°F | Resp 18 | Ht 62.0 in | Wt 145.2 lb

## 2021-04-09 DIAGNOSIS — J020 Streptococcal pharyngitis: Secondary | ICD-10-CM

## 2021-04-09 DIAGNOSIS — J039 Acute tonsillitis, unspecified: Secondary | ICD-10-CM | POA: Diagnosis not present

## 2021-04-09 MED ORDER — AMOXICILLIN 500 MG PO CAPS: 500.0000 mg | ORAL_CAPSULE | Freq: Three times a day (TID) | ORAL | 0 refills | Status: AC

## 2021-04-09 NOTE — Progress Notes (Signed)
Metrics: Intervention Frequency ACO  Documented Smoking Status Yearly  Screened one or more times in 24 months  Cessation Counseling or  Active cessation medication Past 24 months  Past 24 months   Guideline developer: UpToDate (See UpToDate for funding source) Date Released: 2014       Wellness Office Visit  Subjective:  Patient ID: Tonya Mclaughlin, female    DOB: 2001-08-07  Age: 20 y.o. MRN: [id number]  CC: Swollen tonsils/difficulty swallowing food. HPI  Patient describes above symptoms for about 1 week.  She had some prednisone at home and she has been taking it with some relief.  Compared to yesterday when she called for the appointment, she seems to be better.  Her main symptom was 1 of difficulty in swallowing and pain on swallowing.  She denies any significant sore throat.  There is no fever.  There is no cough. She does have a history of tonsillitis in the past and is due to see ENT in the next couple of weeks. Past Medical History:  Diagnosis Date   ADHD (attention deficit hyperactivity disorder)    Asthma    Constipation    Irregular bleeding 02/13/2015   Migraine    History reviewed. No pertinent surgical history.   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   Social History   Tobacco Use   Smoking status: Never   Smokeless tobacco: Never  Substance Use Topics   Alcohol use: Yes    Comment: occ    Current Meds  Medication Sig   albuterol (PROVENTIL) (2.5 MG/3ML) 0.083% nebulizer solution Take 3 mLs (2.5 mg total) by nebulization every 6 (six) hours as needed for wheezing or shortness of breath.   Albuterol Sulfate (PROAIR RESPICLICK) 108 (90 Base) MCG/ACT AEPB Inhale 1-2 puffs into the lungs every 6 (six) hours as needed.   Cranberry 250 MG CAPS  Take 1 capsule by mouth daily.   dicyclomine (BENTYL) 10 MG capsule Take 1 capsule (10 mg total) by mouth every 12 (twelve) hours as needed for spasms.   methocarbamol (ROBAXIN) 750 MG tablet Take 750 mg by mouth every 8 (eight) hours as needed. For back spasm .   Multiple Vitamins-Minerals (ONE-A-DAY WOMENS PO) Take 1 tablet by mouth.   predniSONE (DELTASONE) 20 MG tablet Take 2 tablets (40 mg total) by mouth daily with breakfast.   amoxicillin (AMOXIL) 500 MG capsule Take 1 capsule (500 mg total) by mouth 3 (three) times daily for 10 days.     Flowsheet Row Office Visit from 11/28/2020 in [facility]  PHQ-9 Total Score 11       Objective:   Today's Vitals: BP 118/70 (BP Location: Right Arm, Patient Position: Sitting, Cuff Size: Small)   Pulse 86   Temp 97.8 F (36.6 C) (Temporal)   Resp 18   Ht 5\' 2"  (1.575 m)   Wt 145 lb 3.2 oz (65.9 kg)   SpO2 99%   BMI 26.56 kg/m  Vitals with BMI 04/09/2021 03/02/2021 02/17/2021  Height 5\' 2"  5\' 2"  -  Weight 145 lbs 3 oz 147 lbs -  BMI 26.55 26.88 -  Systolic 118 138 02/19/2021  Diastolic 70 83 89  Pulse 86 79 80  Physical Exam  Tonsils are somewhat inflamed and enlarged but not touching each other.  She is afebrile.     Assessment   1. Tonsillitis   2. Strep throat       Tests ordered No orders of the defined types were placed in this encounter.    Plan: 1.  Strep test was done in the office which was positive.  I will treat her with amoxicillin.  She does not have a specific allergic to penicillin but she was told that she is not responsive to penicillins a long time ago so I am not sure what that means.  Therefore, I will nonetheless treated with amoxicillin hopefully she will improve.  She will follow-up with Dr. Suszanne Conners, ENT in the early part of August.    Meds ordered this encounter  Medications   amoxicillin (AMOXIL) 500 MG capsule    Sig: Take 1 capsule (500 mg total) by mouth 3 (three) times daily for 10  days.    Dispense:  30 capsule    Refill:  0     Howell Groesbeck Normajean Glasgow, MD

## 2021-04-20 IMAGING — NM NUCLEAR MEDICINE HEPATOBILIARY IMAGING WITH GALLBLADDER EF
2 series · 12 of 12 positions shown · non-contrast
Comparison: None

CLINICAL DATA: Abdominal pain with nausea, no vomiting, symptoms
for years, pain and nausea were since 0357

EXAM:
NUCLEAR MEDICINE HEPATOBILIARY IMAGING WITH GALLBLADDER EF
TECHNIQUE: Sequential images of the abdomen were obtained [DATE] minutes
following intravenous administration of radiopharmaceutical. After
oral ingestion of Ensure, gallbladder ejection fraction was
determined. At 60 min, normal ejection fraction is greater than 33%.
RADIOPHARMACEUTICALS:  5.42 mCi Yc-RRm  Choletec IV

[Series 1000: gallbladder ef · 4.80mm/px · 6 of 120 frames shown]
[frame 11/120]
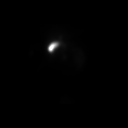
[frame 31/120]
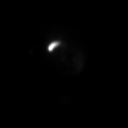
[frame 51/120]
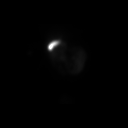
[frame 71/120]
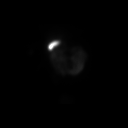
[frame 91/120]
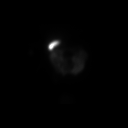
[frame 111/120]
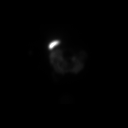

[Series 1000: hepatobiliary scan · 9.59mm/px · 6 of 60 frames shown]
[frame 6/60]
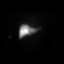
[frame 16/60]
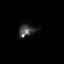
[frame 26/60]
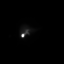
[frame 36/60]
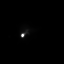
[frame 46/60]
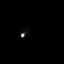
[frame 56/60]
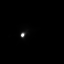

[12 of 12 positions shown; findings below may reference images not displayed]

FINDINGS: Normal tracer extraction from bloodstream indicating normal
hepatocellular function.

Normal excretion of tracer into biliary tree.

Gallbladder visualized at 15 min.

Small bowel not visualized until following fatty meal stimulation

No hepatic retention of tracer.

Subjectively normal emptying of tracer from gallbladder following
fatty meal stimulation.

Calculated gallbladder ejection fraction is 37%, normal.

Patient reported no symptoms following Ensure ingestion.

Normal gallbladder ejection fraction following Ensure ingestion is
greater than 33% at 1 hour.
IMPRESSION: Normal exam.

## 2021-04-20 IMAGING — US ULTRASOUND ABDOMEN LIMITED
1 series · 14 of 25 positions shown · non-contrast
Comparison: None

CLINICAL DATA: RIGHT upper quadrant abdominal pain, generalized
pain and nausea for 4 years

EXAM:
ULTRASOUND ABDOMEN LIMITED RIGHT UPPER QUADRANT

[Series 1: ultrasound abdomen limited · 0.20mm/px · 14 of 49 slices shown]
[im 1/49]
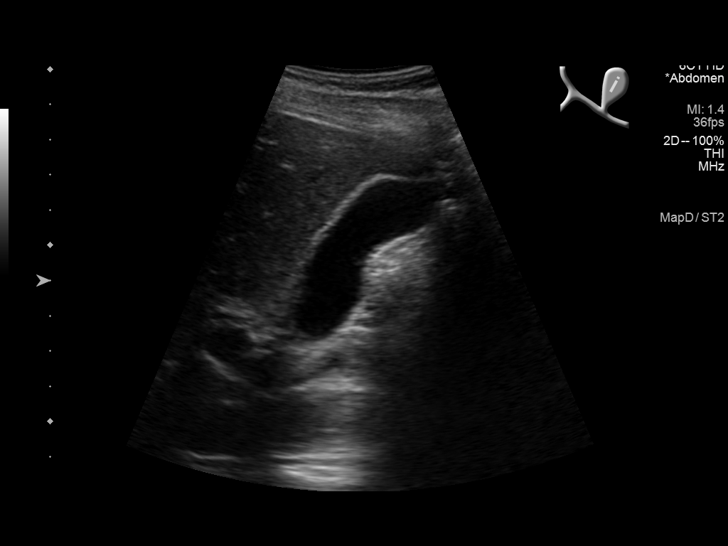
[im 5/49]
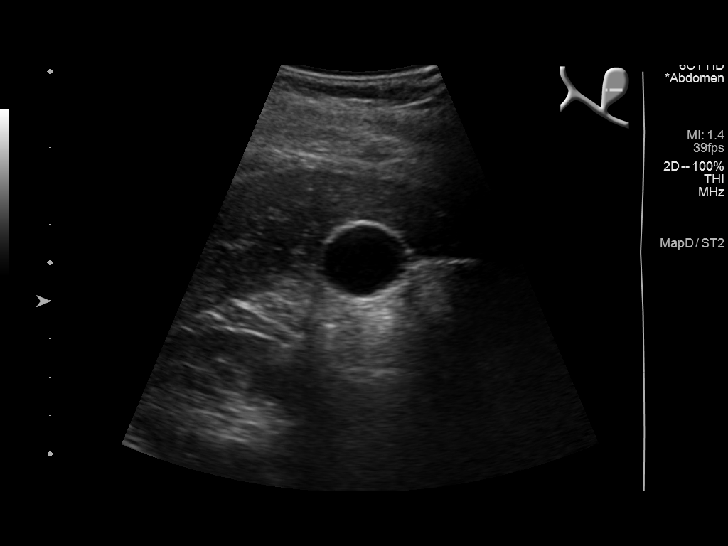
[im 9/49]
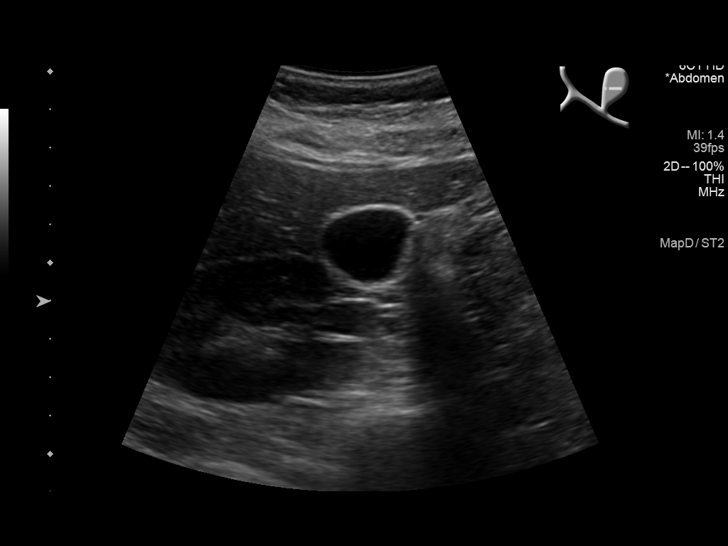
[im 13/49]
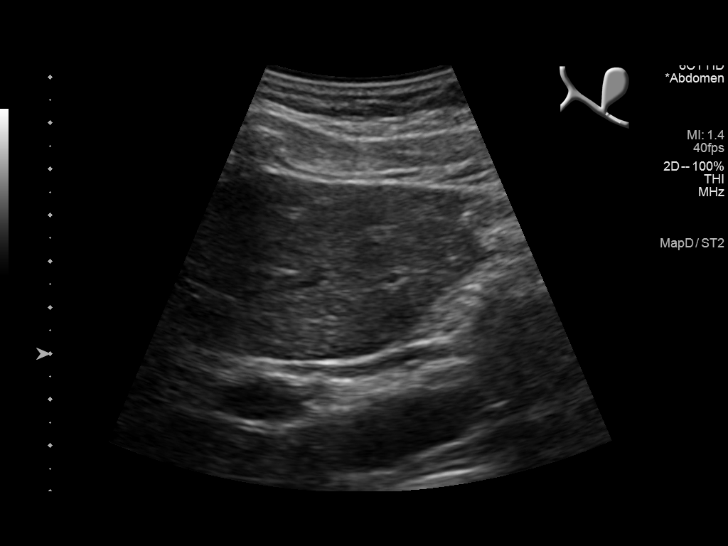
[im 17/49]
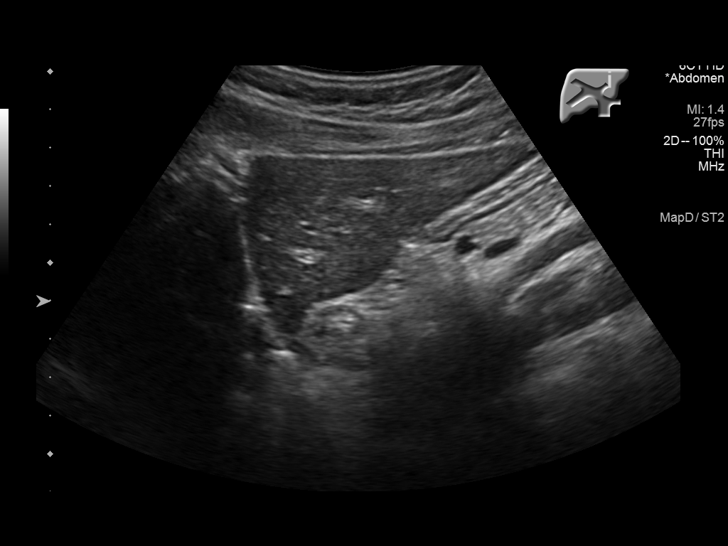
[im 19/49]
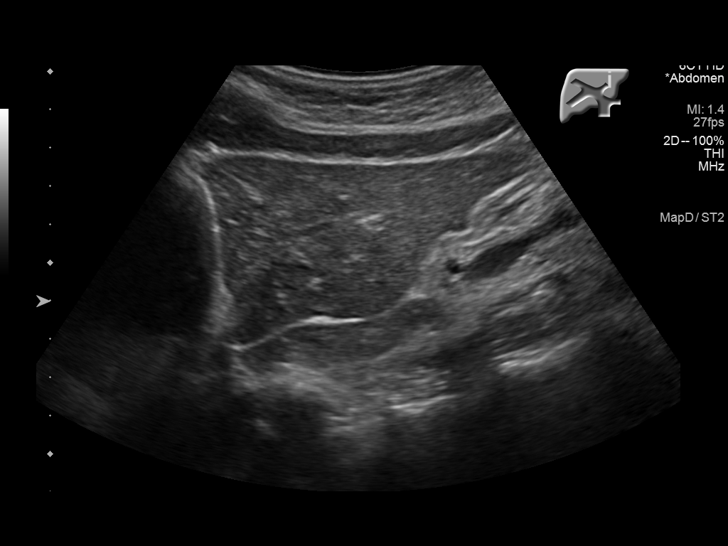
[im 23/49]
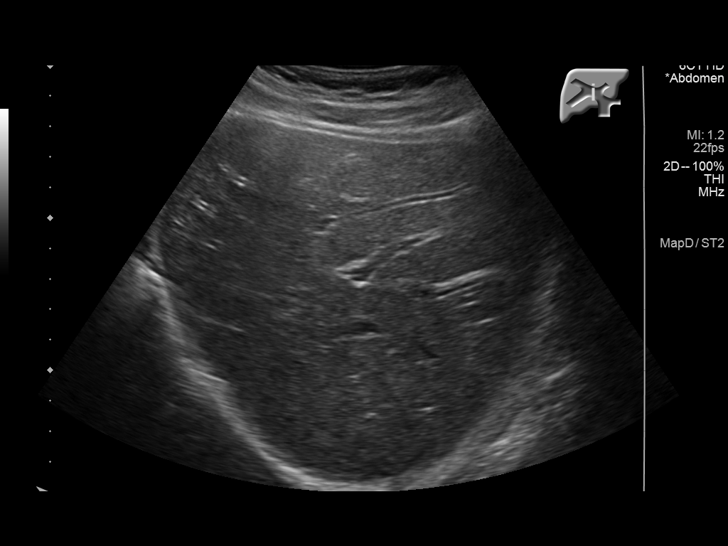
[im 27/49]
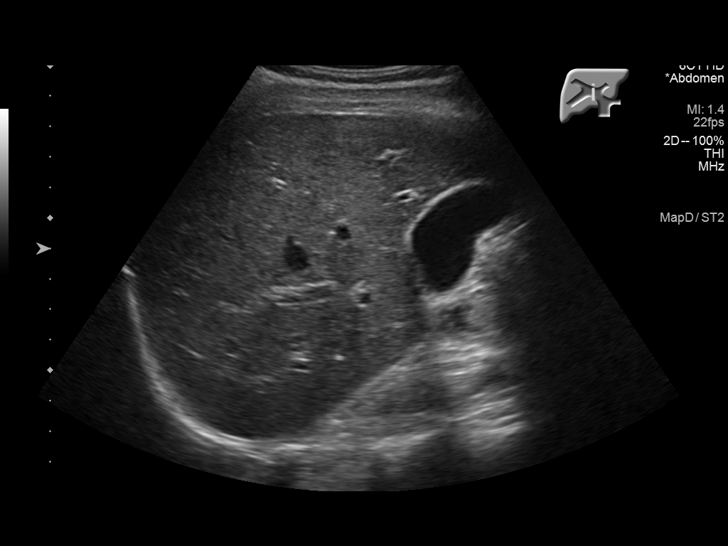
[im 31/49]
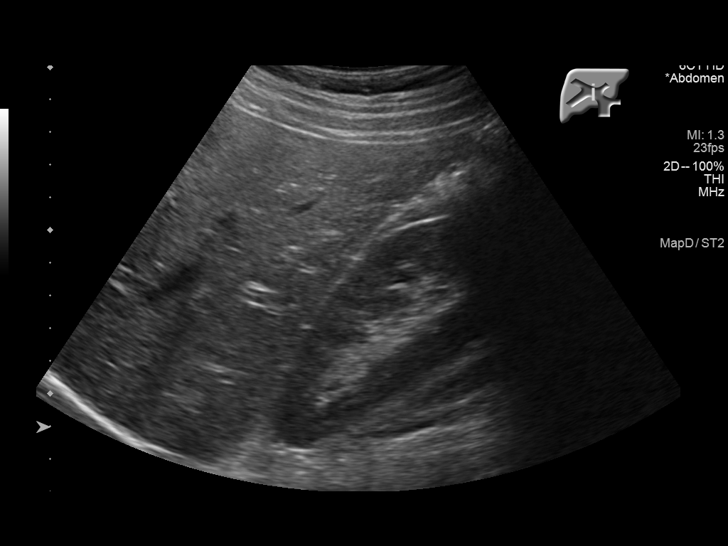
[im 33/49]
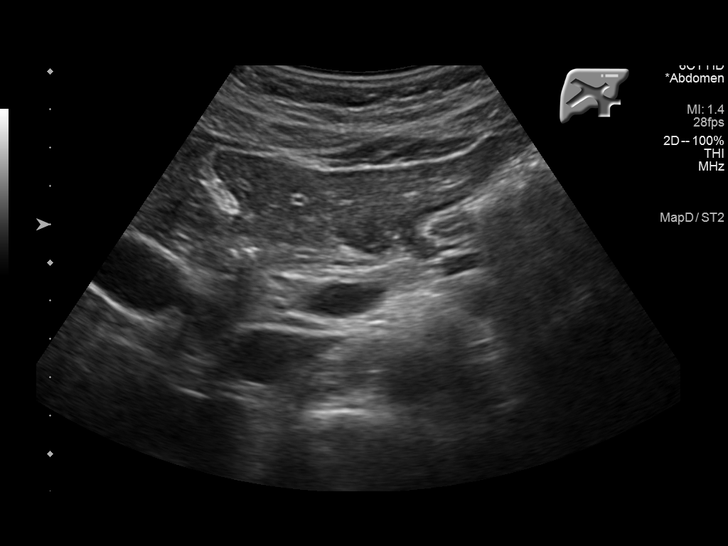
[im 37/49]
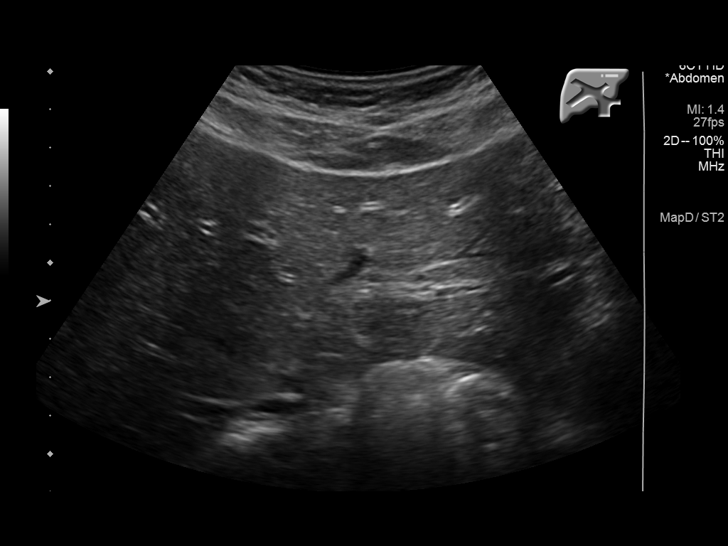
[im 41/49]
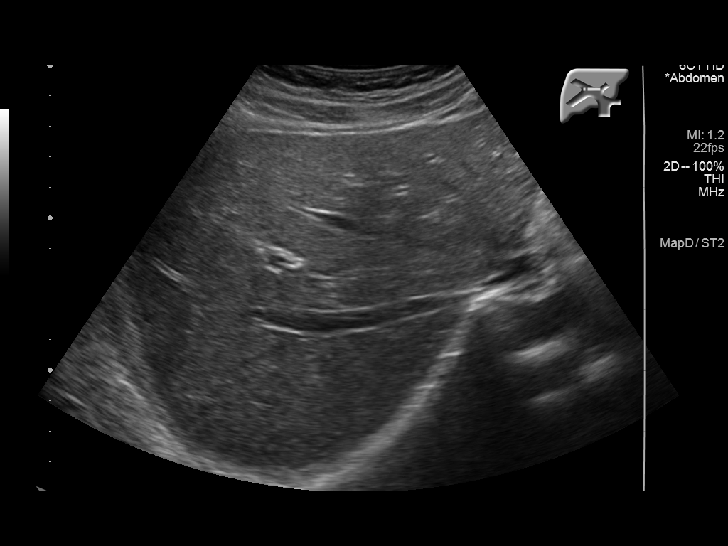
[im 45/49]
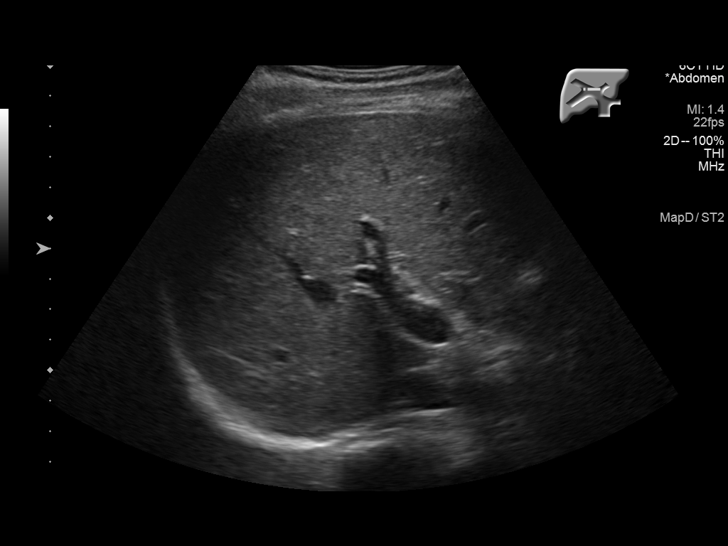
[im 49/49]
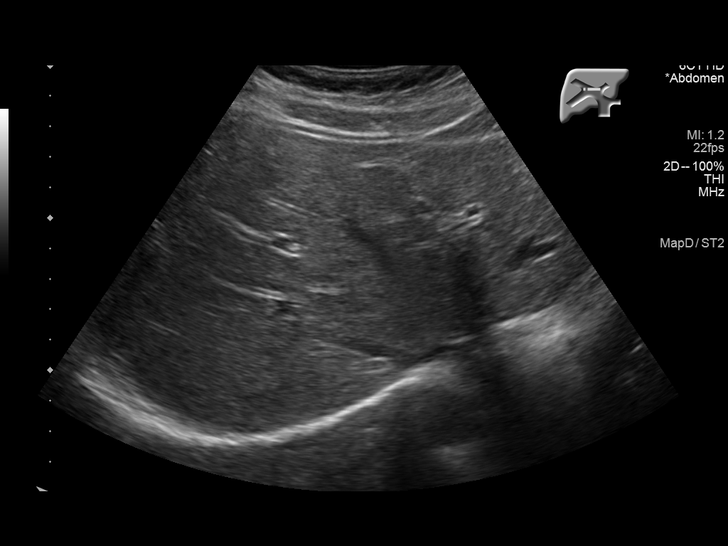

[14 of 25 positions shown; findings below may reference images not displayed]

FINDINGS: Gallbladder:

Normally distended without stones or wall thickening. No
pericholecystic fluid or sonographic Murphy sign.

Common bile duct:

Diameter: 3 mm, normal

Liver:

Normal appearance. No mass or nodularity. Portal vein is patent on
color Doppler imaging with normal direction of blood flow towards
the liver.

No RIGHT upper quadrant free fluid.
IMPRESSION: Normal exam.

## 2021-07-02 ENCOUNTER — Encounter (INDEPENDENT_AMBULATORY_CARE_PROVIDER_SITE_OTHER): Payer: Self-pay | Admitting: Gastroenterology

## 2021-07-02 ENCOUNTER — Ambulatory Visit (INDEPENDENT_AMBULATORY_CARE_PROVIDER_SITE_OTHER): Payer: BC Managed Care – PPO | Admitting: Gastroenterology

## 2021-07-21 ENCOUNTER — Other Ambulatory Visit: Payer: Self-pay

## 2021-07-21 ENCOUNTER — Ambulatory Visit
Admission: EM | Admit: 2021-07-21 | Discharge: 2021-07-21 | Disposition: A | Discharge status: Home / Self Care | Payer: BC Managed Care – PPO | Attending: Emergency Medicine | Admitting: Emergency Medicine

## 2021-07-21 DIAGNOSIS — J4540 Moderate persistent asthma, uncomplicated: Secondary | ICD-10-CM | POA: Diagnosis not present

## 2021-07-21 DIAGNOSIS — R52 Pain, unspecified: Secondary | ICD-10-CM

## 2021-07-21 MED ORDER — ALBUTEROL SULFATE 108 (90 BASE) MCG/ACT IN AEPB: 1.0000 | INHALATION_SPRAY | Freq: Four times a day (QID) | RESPIRATORY_TRACT | 0 refills | Status: AC | PRN

## 2021-07-21 NOTE — ED Triage Notes (Signed)
Pt reports having a sore throat, headache and chills since this morning.

## 2021-07-21 NOTE — Discharge Instructions (Addendum)
You were tested for COVID and flu today, with the results of this will be available in the next 12 to 24 hours, you will be contacted with the result.  You will need to remain out of school until you have received your results, I have provided a note for you.  Please begin using your albuterol inhaler twice daily to address your decreased breath sounds.  If you begin to experience increasing work of breathing despite using your albuterol, please come back for reevaluation hospital steroid treatment.  I have renewed your prescription for you.

## 2021-07-21 NOTE — ED Provider Notes (Signed)
[facility]    CSN: [id number] Arrival date & time: 07/21/21  1357      History   Chief Complaint Chief Complaint  Patient presents with   Sore Throat    HPI Tonya Mclaughlin is a 20 y.o. female.   Patient reports a 12-hour history of sore throat, headache and chills.  Patient's vital signs are normal on arrival today.  Per my observation, patient is well-appearing.  Patient states that she has a history of pneumonia and is concerned that when she has in her upper respiratory will get down into her chest.  Patient states has not tried any medications for her symptoms at this time.  Patient denies known sick contacts, history of allergies, frequent recurrence of respiratory tract infections.  The history is provided by the patient.   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 No pertinent surgical history.  OB History     Gravida  0   Para  0   Term  0   Preterm  0   AB  0   Living  0      SAB  0   IAB  0   Ectopic  0   Multiple  0   Live Births               Home Medications    Prior to Admission medications   Medication Sig Start Date End Date Taking? Authorizing Provider  albuterol (PROVENTIL) (2.5 MG/3ML) 0.083% nebulizer solution Take 3 mLs (2.5 mg total) by nebulization every 6 (six) hours as needed for wheezing or shortness of breath. 07/30/20   Elenore Paddy, NP  Albuterol Sulfate (PROAIR RESPICLICK) 108 (90 Base) MCG/ACT AEPB Inhale 1-2 puffs into the lungs every 6 (six) hours as needed. 07/21/21   Theadora Rama Scales, PA-C  Cranberry 250 MG CAPS Take 1 capsule by mouth daily.    [provider]  dicyclomine (BENTYL) 10 MG capsule Take 1 capsule (10 mg total) by mouth every 12 (twelve) hours as needed for spasms. 03/02/21   Dolores Frame, MD  methocarbamol (ROBAXIN) 750 MG tablet Take 750 mg by mouth every 8 (eight) hours as needed. For back spasm . 05/20/20   [provider]  Multiple Vitamins-Minerals (ONE-A-DAY WOMENS PO) Take 1 tablet by mouth.    [provider]    Family History Family History  Problem Relation Age of Onset   Hypertension Mother    Migraines Mother     Social History Social History   Tobacco Use   Smoking status: Never   Smokeless tobacco: Never  Vaping Use   Vaping Use: Never used  Substance Use Topics   Alcohol use: Yes    Comment: occ   Drug use: No     Allergies   Penicillins   Review of Systems Review of Systems Pertinent findings noted in history of present illness.    Physical Exam Triage Vital Signs ED Triage Vitals  Enc Vitals Group     BP  Pulse      Resp      Temp      Temp src      SpO2      Weight      Height      Head Circumference      Peak Flow      Pain Score      Pain Loc      Pain Edu?      Excl. in GC?    No data found.  Updated Vital Signs BP 121/82 (BP Location: Right Arm)   Pulse 88   Temp 98.6 F (37 C) (Oral)   Resp 18   LMP 07/17/2021   SpO2 98%   Visual Acuity Right Eye Distance:   Left Eye Distance:   Bilateral Distance:    Right Eye Near:   Left Eye Near:    Bilateral Near:     Physical Exam Vitals and nursing note reviewed.  Constitutional:      General: She is not in acute distress.    Appearance: Normal appearance. She is not ill-appearing.  HENT:     Head: Normocephalic and atraumatic.     Salivary Glands: Right salivary gland is not diffusely enlarged or tender. Left salivary gland is not diffusely enlarged or tender.     Right Ear:  Tympanic membrane, ear canal and external ear normal. No drainage. No middle ear effusion. There is no impacted cerumen. Tympanic membrane is not erythematous or bulging.     Left Ear: Tympanic membrane, ear canal and external ear normal. No drainage.  No middle ear effusion. There is no impacted cerumen. Tympanic membrane is not erythematous or bulging.     Nose: Nose normal. No nasal deformity, septal deviation, mucosal edema, congestion or rhinorrhea.     Right Turbinates: Not enlarged, swollen or pale.     Left Turbinates: Not enlarged, swollen or pale.     Right Sinus: No maxillary sinus tenderness or frontal sinus tenderness.     Left Sinus: No maxillary sinus tenderness or frontal sinus tenderness.     Mouth/Throat:     Lips: Pink. No lesions.     Mouth: Mucous membranes are moist. No oral lesions.     Pharynx: Oropharynx is clear. Uvula midline. No posterior oropharyngeal erythema or uvula swelling.     Tonsils: No tonsillar exudate. 0 on the right. 0 on the left.  Eyes:     General: Lids are normal.        Right eye: No discharge.        Left eye: No discharge.     Extraocular Movements: Extraocular movements intact.     Conjunctiva/sclera: Conjunctivae normal.     Right eye: Right conjunctiva is not injected.     Left eye: Left conjunctiva is not injected.     Pupils: Pupils are equal, round, and reactive to light.  Neck:     Trachea: Trachea and phonation normal.  Cardiovascular:     Rate and Rhythm: Normal rate and regular rhythm.     Pulses: Normal pulses.     Heart sounds: Normal heart sounds. No murmur heard.   No friction rub. No gallop.  Pulmonary:     Effort: Pulmonary effort is normal. No accessory muscle usage, prolonged expiration or respiratory distress.     Breath sounds: Normal breath sounds. No stridor, decreased air movement or transmitted upper airway sounds. No decreased breath sounds, wheezing, rhonchi or rales.  Chest:  Chest wall: No tenderness.   Musculoskeletal:        General: Normal range of motion.     Cervical back: Normal range of motion and neck supple. Normal range of motion.  Lymphadenopathy:     Cervical: No cervical adenopathy.  Skin:    General: Skin is warm and dry.     Findings: No erythema or rash.  Neurological:     General: No focal deficit present.     Mental Status: She is alert and oriented to person, place, and time. Mental status is at baseline.  Psychiatric:        Mood and Affect: Mood normal.        Behavior: Behavior normal.     UC Treatments / Results  Labs (all labs ordered are listed, but only abnormal results are displayed) Labs Reviewed  COVID-19, FLU A+B NAA    EKG   Radiology No results found.  Procedures Procedures (including critical care time)  Medications Ordered in UC Medications - No data to display  Initial Impression / Assessment and Plan / UC Course  I have reviewed the triage vital signs and the nursing notes.  Pertinent labs & imaging results that were available during my care of the patient were reviewed by me and considered in my medical decision making (see chart for details).    COVID and flu testing was performed in clinic today, patient advised to be notified of the results once received.  Patient advised to remain out of class until she has received her results.  I have renewed her albuterol inhaler and recommended that she use it at least twice daily at this time to keep her lungs calm and prevent possible pneumonia.  Patient verbalized understanding and agreement of plan as discussed.  All questions were addressed during visit.  Please see discharge instructions below for further details of plan.  Final Clinical Impressions(s) / UC Diagnoses   Final diagnoses:  Body aches     Discharge Instructions      You were tested for COVID and flu today, with the results of this will be available in the next 12 to 24 hours, you will be contacted with the  result.  You will need to remain out of school until you have received your results, I have provided a note for you.  Please begin using your albuterol inhaler twice daily to address your decreased breath sounds.  If you begin to experience increasing work of breathing despite using your albuterol, please come back for reevaluation hospital steroid treatment.  I have renewed your prescription for you.     ED Prescriptions     Medication Sig Dispense Auth. Provider   Albuterol Sulfate (PROAIR RESPICLICK) 108 (90 Base) MCG/ACT AEPB Inhale 1-2 puffs into the lungs every 6 (six) hours as needed. 1 each Theadora Rama Scales, PA-C      PDMP not reviewed this encounter.   Theadora Rama Scales, PA-C 07/22/21 [phone]

## 2021-07-23 LAB — COVID-19, FLU A+B NAA
Influenza A, NAA: NOT DETECTED
Influenza B, NAA: NOT DETECTED
SARS-CoV-2, NAA: NOT DETECTED

## 2021-09-04 ENCOUNTER — Other Ambulatory Visit: Payer: Self-pay | Admitting: Otolaryngology

## 2021-09-29 DIAGNOSIS — F909 Attention-deficit hyperactivity disorder, unspecified type: Secondary | ICD-10-CM | POA: Insufficient documentation

## 2021-10-05 DIAGNOSIS — F411 Generalized anxiety disorder: Secondary | ICD-10-CM | POA: Insufficient documentation

## 2021-10-05 DIAGNOSIS — J45909 Unspecified asthma, uncomplicated: Secondary | ICD-10-CM | POA: Insufficient documentation

## 2021-10-05 DIAGNOSIS — J309 Allergic rhinitis, unspecified: Secondary | ICD-10-CM | POA: Insufficient documentation

## 2021-10-12 ENCOUNTER — Ambulatory Visit (HOSPITAL_BASED_OUTPATIENT_CLINIC_OR_DEPARTMENT_OTHER): Admit: 2021-10-12 | Payer: BC Managed Care – PPO | Admitting: Otolaryngology

## 2021-10-12 ENCOUNTER — Encounter (HOSPITAL_BASED_OUTPATIENT_CLINIC_OR_DEPARTMENT_OTHER): Payer: Self-pay

## 2021-10-12 SURGERY — TONSILLECTOMY AND ADENOIDECTOMY
Anesthesia: General

## 2021-10-30 DIAGNOSIS — H9201 Otalgia, right ear: Secondary | ICD-10-CM | POA: Insufficient documentation

## 2021-12-30 DIAGNOSIS — J019 Acute sinusitis, unspecified: Secondary | ICD-10-CM | POA: Insufficient documentation

## 2022-01-05 ENCOUNTER — Encounter: Payer: Self-pay | Admitting: *Deleted

## 2022-01-11 ENCOUNTER — Other Ambulatory Visit: Payer: Self-pay | Admitting: Pediatrics

## 2022-01-11 MED ORDER — SILVER SULFADIAZINE 1 % EX CREA: 1.0000 "application " | TOPICAL_CREAM | Freq: Every day | CUTANEOUS | 0 refills | Status: AC

## 2022-01-25 ENCOUNTER — Encounter: Payer: Self-pay | Admitting: Women's Health

## 2022-01-25 ENCOUNTER — Other Ambulatory Visit (HOSPITAL_COMMUNITY)
Admission: RE | Admit: 2022-01-25 | Discharge: 2022-01-25 | Disposition: A | Discharge status: Home / Self Care | Payer: BC Managed Care – PPO | Source: Ambulatory Visit | Attending: Women's Health | Admitting: Women's Health

## 2022-01-25 ENCOUNTER — Ambulatory Visit: Payer: BC Managed Care – PPO | Admitting: Women's Health

## 2022-01-25 VITALS — BP 125/85 | HR 78 | Wt 154.0 lb

## 2022-01-25 DIAGNOSIS — Z113 Encounter for screening for infections with a predominantly sexual mode of transmission: Secondary | ICD-10-CM

## 2022-01-25 DIAGNOSIS — N890 Mild vaginal dysplasia: Secondary | ICD-10-CM | POA: Diagnosis not present

## 2022-01-25 DIAGNOSIS — N898 Other specified noninflammatory disorders of vagina: Secondary | ICD-10-CM | POA: Diagnosis not present

## 2022-01-25 NOTE — Progress Notes (Signed)
? ?  GYN VISIT ?Patient name: Tonya Mclaughlin MRN [id number]  Date of birth: 27-Aug-2001 ?Chief Complaint:   ?No chief complaint on file. ? ?History of Present Illness:   ?Tonya Mclaughlin is a 21 y.o. G0P0000 female being seen today for report of vulvovaginal irritation since Thursday, did notice some thick d/c just prior to period starting. Tried otc yeast spray- burned, so stopped using. No odor. Tried to use tampon and hurt. New sex partner.     ?Patient's last menstrual period was 01/23/2022. ?Last pap 11/28/20. Results were: NILM w/ HRHPV not done ? ? ?  11/28/2020  ? 12:48 PM 08/18/2020  ?  5:25 PM 10/04/2017  ? 11:39 AM 01/25/2017  ? 10:46 AM 12/14/2016  ?  8:27 AM  ?Depression screen PHQ 2/9  ?Decreased Interest 2 1 0 0 1  ?Down, Depressed, Hopeless 1 3 0 0 1  ?PHQ - 2 Score 3 4 0 0 2  ?Altered sleeping 1 3  0 0  ?Tired, decreased energy 3 3  0 1  ?Change in appetite 2 2  0 2  ?Feeling bad or failure about yourself  1 0  0 0  ?Trouble concentrating 1 3  0 0  ?Moving slowly or fidgety/restless 0 1  0 0  ?Suicidal thoughts 0 0  0 0  ?PHQ-9 Score 11 16  0 5  ? ?  ? ?  11/28/2020  ? 12:49 PM  ?GAD 7 : Generalized Anxiety Score  ?Nervous, Anxious, on Edge 2  ?Control/stop worrying 0  ?Worry too much - different things 1  ?Trouble relaxing 1  ?Restless 0  ?Easily annoyed or irritable 1  ?Afraid - awful might happen 0  ?Total GAD 7 Score 5  ? ? ? ?Review of Systems:   ?Pertinent items are noted in HPI ?Denies fever/chills, dizziness, headaches, visual disturbances, fatigue, shortness of breath, chest pain, abdominal pain, vomiting, abnormal vaginal discharge/itching/odor/irritation, problems with periods, bowel movements, urination, or intercourse unless otherwise stated above.  ?Pertinent History Reviewed:  ?Reviewed past medical,surgical, social, obstetrical and family history.  ?Reviewed problem list, medications and allergies. ?Physical Assessment:  ? ?Vitals:  ? 01/25/22 1627  ?BP: 125/85  ?Pulse: 78  ?Weight: 154 lb (69.9 kg)  ?Body  mass index is 28.17 kg/m?. ? ?     Physical Examination:  ? General appearance: alert, well appearing, and in no distress ? Mental status: alert, oriented to person, place, and time ? Skin: warm & dry  ? Cardiovascular: normal heart rate noted ? Respiratory: normal respiratory effort, no distress ? Abdomen: soft, non-tender  ? Pelvic: VULVA: normal appearing vulva with no masses, tenderness or lesions, VAGINA: normal appearing vagina with normal color and discharge, no lesions, menstrual blood, no obvious odor CERVIX: normal appearing cervix without discharge or lesions ? Extremities: no edema  ? ?Chaperone: Jobe Marker   ? ?No results found for this or any previous visit (from the past 24 hour(s)).  ?Assessment & Plan:  ?1) Vulvovaginal irritation> CV swab ? ?2) STD screen ? ?Meds: No orders of the defined types were placed in this encounter. ? ? ?No orders of the defined types were placed in this encounter. ? ? ?Return in about 1 year (around 01/26/2023) for Physical. ? ?Cheral Marker CNM, WHNP-BC ?01/25/2022 ?4:39 PM  ?

## 2022-01-27 LAB — CERVICOVAGINAL ANCILLARY ONLY
Bacterial Vaginitis (gardnerella): POSITIVE — AB
Candida Glabrata: NEGATIVE
Candida Vaginitis: NEGATIVE
Chlamydia: NEGATIVE
Comment: NEGATIVE
Comment: NEGATIVE
Comment: NEGATIVE
Comment: NEGATIVE
Comment: NEGATIVE
Comment: NORMAL
Neisseria Gonorrhea: NEGATIVE
Trichomonas: NEGATIVE

## 2022-01-29 ENCOUNTER — Other Ambulatory Visit: Payer: Self-pay | Admitting: Adult Health

## 2022-01-29 MED ORDER — METRONIDAZOLE 500 MG PO TABS: 500.0000 mg | ORAL_TABLET | Freq: Two times a day (BID) | ORAL | 0 refills | Status: DC

## 2022-01-29 NOTE — Progress Notes (Signed)
+  BV on vaginal swab, will rx flagyl, no sex or alcohol, during treatment. ?

## 2022-03-04 ENCOUNTER — Ambulatory Visit (INDEPENDENT_AMBULATORY_CARE_PROVIDER_SITE_OTHER): Payer: BC Managed Care – PPO | Admitting: Advanced Practice Midwife

## 2022-03-04 ENCOUNTER — Other Ambulatory Visit (HOSPITAL_COMMUNITY)
Admission: RE | Admit: 2022-03-04 | Discharge: 2022-03-04 | Disposition: A | Discharge status: Home / Self Care | Payer: BC Managed Care – PPO | Source: Ambulatory Visit | Attending: Adult Health | Admitting: Adult Health

## 2022-03-04 ENCOUNTER — Encounter: Payer: Self-pay | Admitting: Advanced Practice Midwife

## 2022-03-04 ENCOUNTER — Ambulatory Visit: Payer: BC Managed Care – PPO | Admitting: Adult Health

## 2022-03-04 VITALS — BP 135/85 | HR 78 | Wt 152.0 lb

## 2022-03-04 DIAGNOSIS — Z113 Encounter for screening for infections with a predominantly sexual mode of transmission: Secondary | ICD-10-CM | POA: Diagnosis not present

## 2022-03-04 DIAGNOSIS — R300 Dysuria: Secondary | ICD-10-CM

## 2022-03-04 LAB — POCT URINALYSIS DIPSTICK
Glucose, UA: NEGATIVE
Ketones, UA: NEGATIVE
Leukocytes, UA: NEGATIVE
Nitrite, UA: NEGATIVE
Protein, UA: NEGATIVE

## 2022-03-04 MED ORDER — CEFADROXIL 500 MG PO CAPS: 500.0000 mg | ORAL_CAPSULE | Freq: Two times a day (BID) | ORAL | 0 refills | Status: DC

## 2022-03-04 NOTE — Progress Notes (Signed)
[facility] Visit  Patient name: Tonya Mclaughlin MRN [id number]  Date of birth: [birth year]  CC & HPI:  Tonya Mclaughlin is a 21 y.o.  female presenting today for intermittent dysuria.  Had several days of burning (at urethra) w/voiding.  It went away, then came back as burning at end of and after voiding  Hasn't had sx for a day or two.  Took meds for BV a few weeks ago, those sx (vaginal itch/burn) have resolved.   Pertinent History Reviewed:  Medical & Surgical Hx:   Past Medical History:  Diagnosis Date   ADHD (attention deficit hyperactivity disorder)    Asthma    Constipation    Irregular bleeding 02/13/2015   Migraine    History reviewed. No pertinent surgical history. Family History  Problem Relation Age of Onset   Hypertension Mother    Migraines Mother     Current Outpatient Medications:    cefadroxil (DURICEF) 500 MG capsule, Take 1 capsule (500 mg total) by mouth 2 (two) times daily., Disp: 14 capsule, Rfl: 0   albuterol (PROVENTIL) (2.5 MG/3ML) 0.083% nebulizer solution, Take 3 mLs (2.5 mg total) by nebulization every 6 (six) hours as needed for wheezing or shortness of breath. (Patient not taking: Reported on 01/25/2022), Disp: 150 mL, Rfl: 1   Albuterol Sulfate (PROAIR RESPICLICK) 108 (90 Base) MCG/ACT AEPB, Inhale 1-2 puffs into the lungs every 6 (six) hours as needed. (Patient not taking: Reported on 01/25/2022), Disp: 1 each, Rfl: 0   Cranberry 250 MG CAPS, Take 1 capsule by mouth daily. (Patient not taking: Reported on 01/25/2022), Disp: , Rfl:    dicyclomine (BENTYL) 10 MG capsule, Take 1 capsule (10 mg total) by mouth every 12 (twelve) hours as needed for spasms. (Patient not taking: Reported on 01/25/2022), Disp: 60 capsule, Rfl: 2   methocarbamol (ROBAXIN) 750 MG tablet, Take 750 mg by mouth every 8 (eight) hours as needed. For back spasm . (Patient not taking: Reported on 03/04/2022), Disp: , Rfl:    Multiple Vitamins-Minerals (ONE-A-DAY WOMENS PO), Take 1 tablet by mouth.  (Patient not taking: Reported on 03/04/2022), Disp: , Rfl:    silver sulfADIAZINE (SILVADENE) 1 % cream, Apply 1 application. topically daily. (Patient not taking: Reported on 01/25/2022), Disp: 50 g, Rfl: 0 Social History: Reviewed -  reports that she has never smoked. She has never used smokeless tobacco.  Review of Systems:   Constitutional: Negative for fever and chills Eyes: Negative for visual disturbances Respiratory: Negative for shortness of breath, dyspnea Cardiovascular: Negative for chest pain or palpitations  Gastrointestinal: Negative for vomiting, diarrhea and constipation; no abdominal pain Genitourinary: Negative for vaginal irritation or itching Musculoskeletal: Negative for back pain, joint pain, myalgias  Neurological: Negative for dizziness and headaches    Objective Findings:    Physical Examination: Vitals:   03/04/22 1534  BP: 135/85  Pulse: 78   General appearance - well appearing, and in no distress Mental status - alert, oriented to person, place, and time Chest:  Normal respiratory effort Heart - normal rate and regular rhythm Abdomen:  Soft, nontender Musculoskeletal:  Normal range of motion without pain Extremities:  No edema    Results for orders placed or performed in visit on 03/04/22 (from the past 24 hour(s))  POCT Urinalysis Dipstick   Collection Time: 03/04/22  3:35 PM  Result Value Ref Range   Color, UA     Clarity, UA     Glucose, UA Negative Negative   Bilirubin, UA  Ketones, UA neg    Spec Grav, UA     Blood, UA small    pH, UA     Protein, UA Negative Negative   Urobilinogen, UA     Nitrite, UA neg    Leukocytes, UA Negative Negative   Appearance     Odor        Assessment & Plan:  A:   UTI vs vaaginitis P:  Urine cx, NuSwab Rx for duricef sent:  pick up and start taking if sx return.    No follow-ups on file.  Jacklyn Shell CNM 03/04/2022 3:56 PM

## 2022-03-06 LAB — URINE CULTURE

## 2022-03-08 ENCOUNTER — Other Ambulatory Visit: Payer: Self-pay | Admitting: Advanced Practice Midwife

## 2022-03-08 ENCOUNTER — Encounter: Payer: Self-pay | Admitting: Advanced Practice Midwife

## 2022-03-08 LAB — CERVICOVAGINAL ANCILLARY ONLY
Bacterial Vaginitis (gardnerella): NEGATIVE
Candida Glabrata: NEGATIVE
Candida Vaginitis: POSITIVE — AB
Comment: NEGATIVE
Comment: NEGATIVE
Comment: NEGATIVE

## 2022-03-08 MED ORDER — FLUCONAZOLE 150 MG PO TABS: ORAL_TABLET | ORAL | 2 refills | Status: DC

## 2022-03-08 NOTE — Progress Notes (Signed)
Diflucan for yeast

## 2022-04-08 ENCOUNTER — Telehealth: Payer: Self-pay

## 2022-04-08 NOTE — Telephone Encounter (Signed)
Pt wants to speak with a nurse to see if she can take a prescription that she has a refill on

## 2022-04-08 NOTE — Telephone Encounter (Signed)
Returned patient's call. States she is starting to have symptoms of a yeast infection again and is wanting to know if she can get the refill.  Informed patient there were 2 refills on the original prescription so that would be fine. If symptoms did not resolve after taking mediation, to make appt.  Pt verbalized understanding with no further questions.

## 2022-04-12 ENCOUNTER — Telehealth: Payer: Self-pay

## 2022-04-12 NOTE — Telephone Encounter (Signed)
Pt has a big trip coming up this weekend. Pt is supposed to start period on Saturday. Pt wants to try Norethisterone. Can pt be prescribed this to delay period? Please advise. Thanks! JSY

## 2022-04-12 NOTE — Telephone Encounter (Signed)
Patient would like for a nurse to call her about the medication Norethisterone, she has some questions about it.

## 2022-04-12 NOTE — Telephone Encounter (Signed)
I told pt I don't think that it would stop her period. She said OK

## 2022-06-02 DIAGNOSIS — J039 Acute tonsillitis, unspecified: Secondary | ICD-10-CM | POA: Insufficient documentation

## 2022-06-02 DIAGNOSIS — R070 Pain in throat: Secondary | ICD-10-CM | POA: Insufficient documentation

## 2022-07-22 IMAGING — US US SOFT TISSUE HEAD/NECK
1 series · 14 of 25 positions shown · non-contrast
Comparison: None.

CLINICAL DATA: Masses behind the ear.

EXAM:
ULTRASOUND OF HEAD/NECK SOFT TISSUES
TECHNIQUE: Ultrasound examination of the head and neck soft tissues was
performed in the area of clinical concern.

[Series 1: us soft tissue head & neck (non-thyroid) · 14 of 28 slices shown]
[im 1/28]
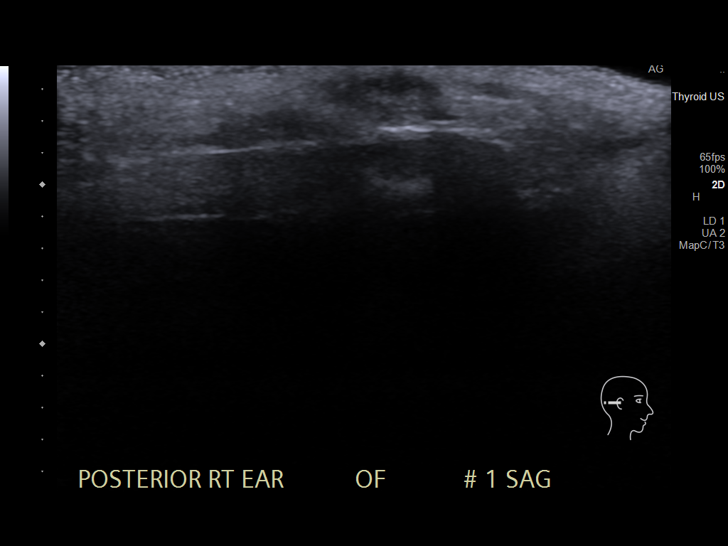
[im 3/28]
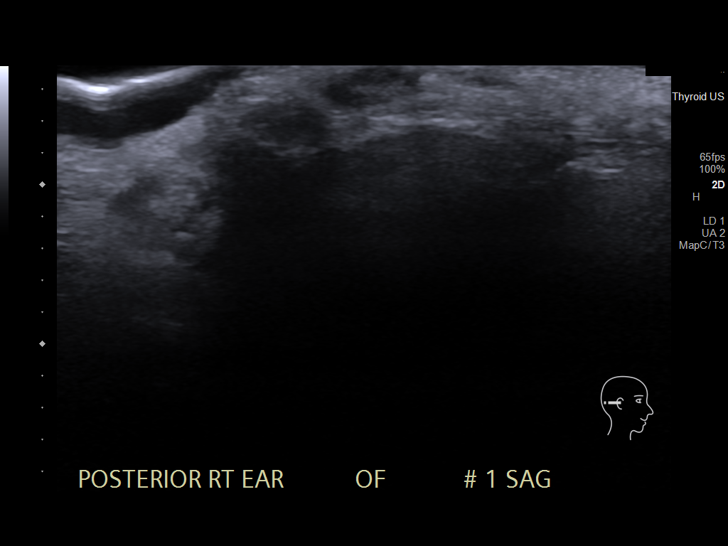
[im 5/28]
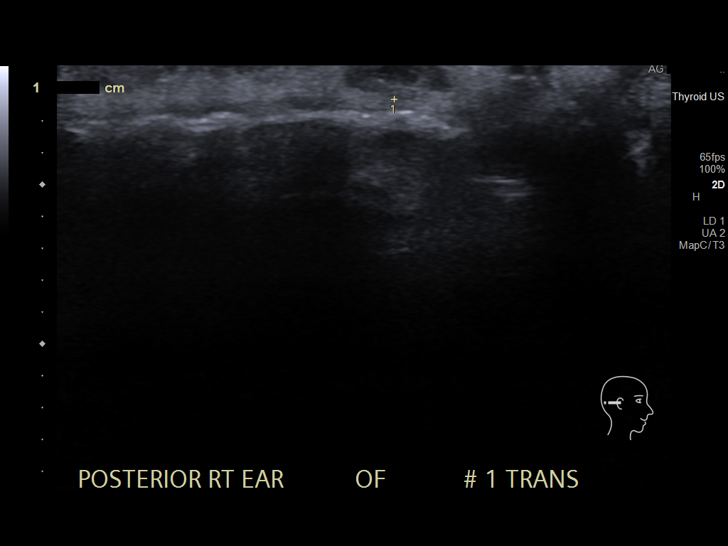
[im 7/28]
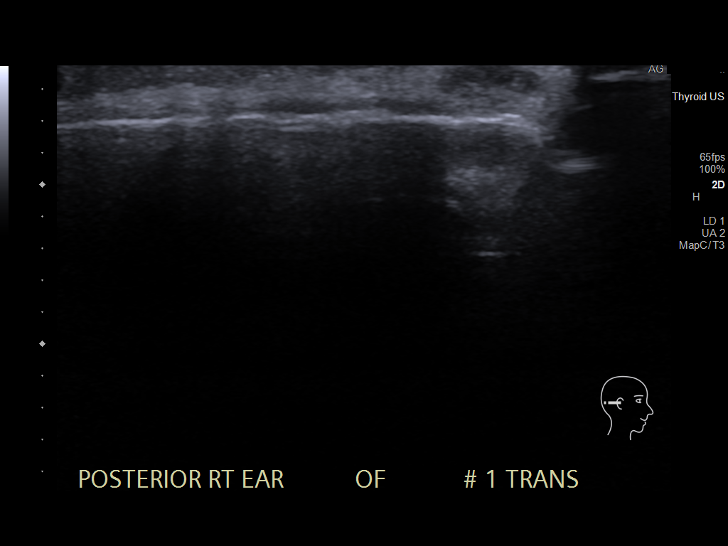
[im 10/28]
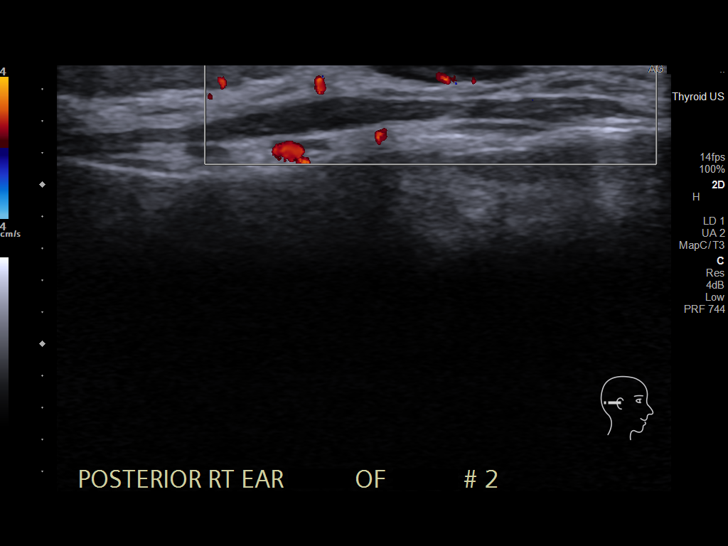
[im 11/28]
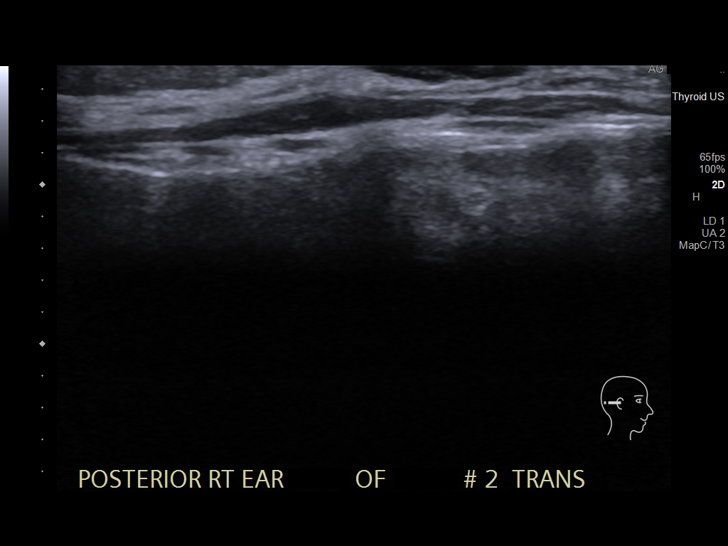
[im 13/28]
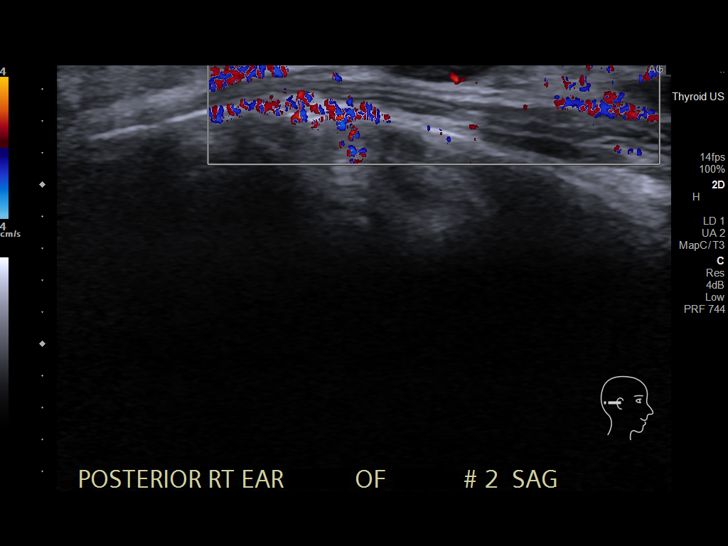
[im 15/28]
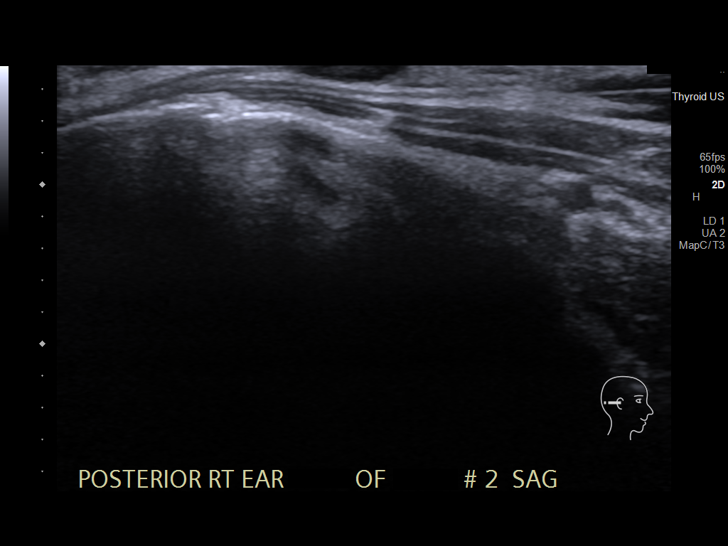
[im 17/28]
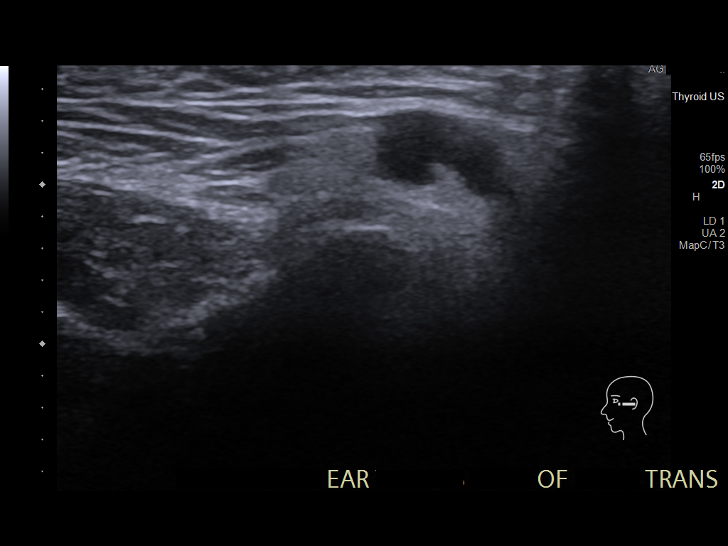
[im 19/28]
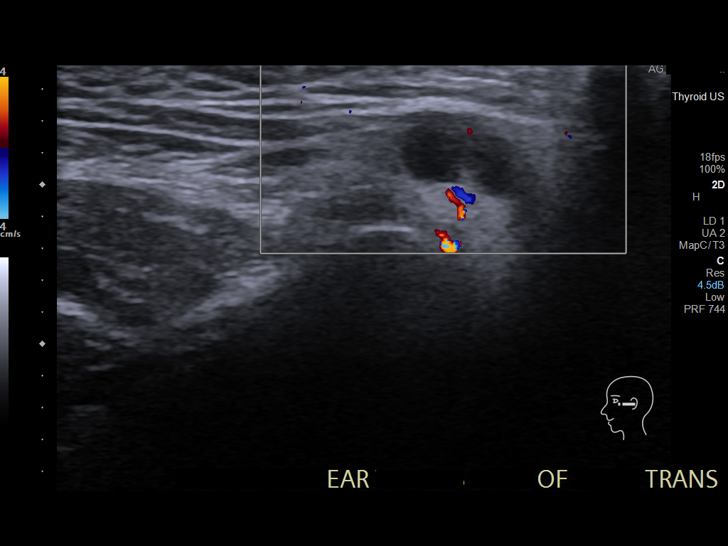
[im 21/28]
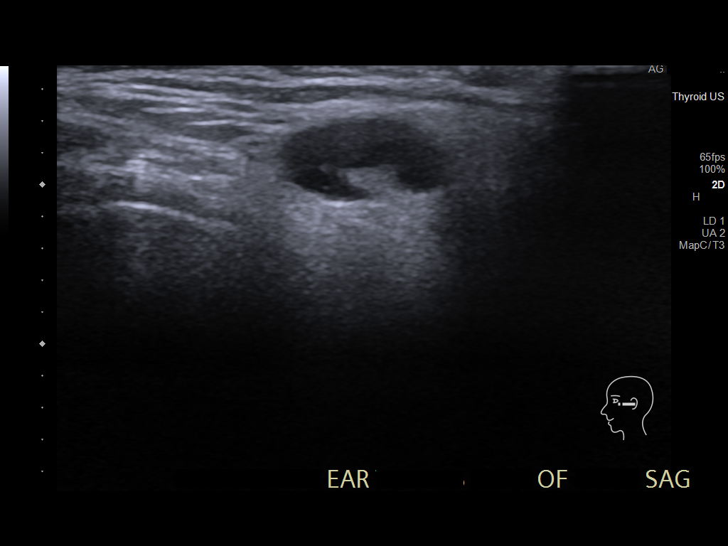
[im 23/28]
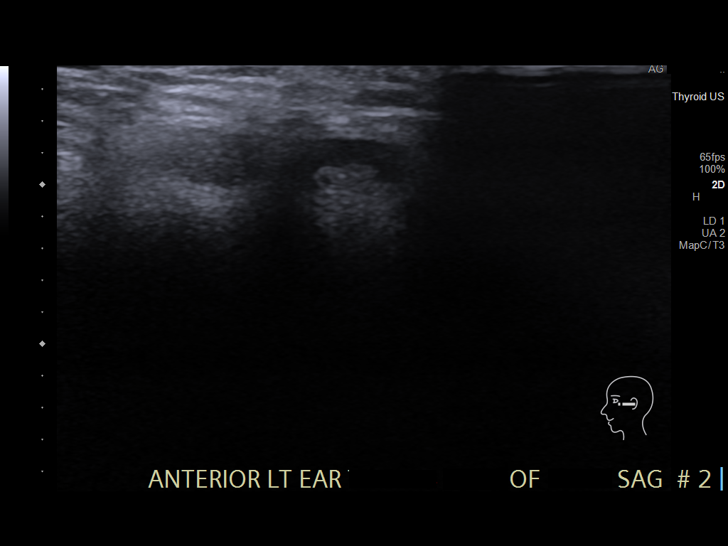
[im 25/28]
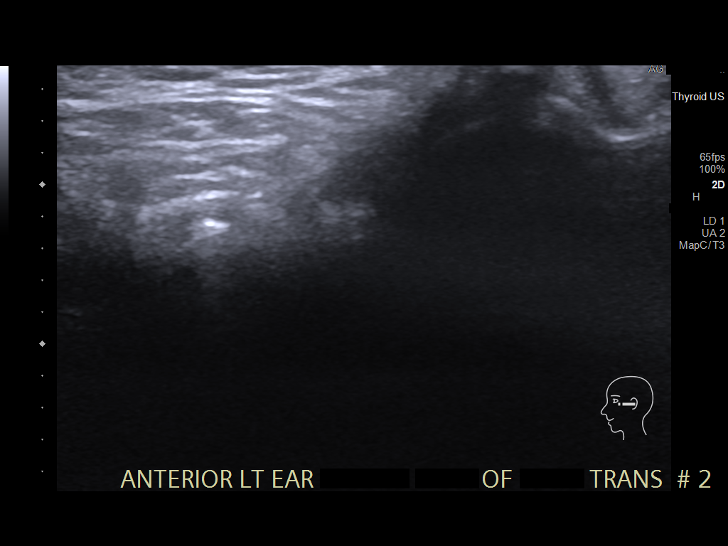
[im 28/28]
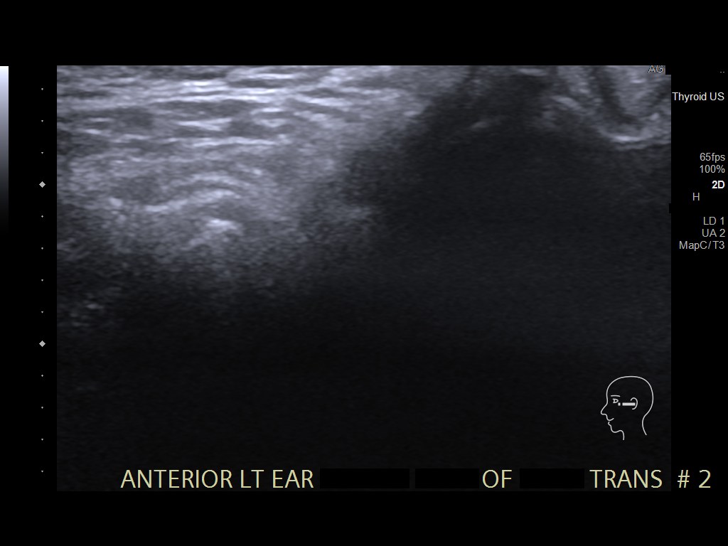

[14 of 25 positions shown; findings below may reference images not displayed]

FINDINGS: In the patient's palpable area of concern bilaterally, there are
multiple small morphologically normal lymph nodes. The largest
located on the left measures approximately 5 mm in the short axis.
This lymph node demonstrates a borderline thickened cortex measuring
approximately 3 mm. On the right, the largest lymph node measures up
to approximately 3 mm.
IMPRESSION: The patient's palpable area of concern corresponds to multiple,
morphologically normal lymph nodes. The largest is located on the
left is favored to be reactive in etiology. In the absence of
interval growth, no further follow-up is recommended.

## 2022-09-14 ENCOUNTER — Encounter: Payer: Self-pay | Admitting: Advanced Practice Midwife

## 2022-09-15 ENCOUNTER — Other Ambulatory Visit (HOSPITAL_COMMUNITY)
Admission: RE | Admit: 2022-09-15 | Discharge: 2022-09-15 | Disposition: A | Discharge status: Home / Self Care | Payer: BC Managed Care – PPO | Source: Ambulatory Visit | Attending: Obstetrics & Gynecology | Admitting: Obstetrics & Gynecology

## 2022-09-15 ENCOUNTER — Other Ambulatory Visit (INDEPENDENT_AMBULATORY_CARE_PROVIDER_SITE_OTHER): Payer: Self-pay

## 2022-09-15 VITALS — BP 116/61 | HR 78 | Ht 62.0 in | Wt 152.0 lb

## 2022-09-15 DIAGNOSIS — Z3202 Encounter for pregnancy test, result negative: Secondary | ICD-10-CM

## 2022-09-15 DIAGNOSIS — N898 Other specified noninflammatory disorders of vagina: Secondary | ICD-10-CM | POA: Diagnosis not present

## 2022-09-15 LAB — POCT URINE PREGNANCY: Preg Test, Ur: NEGATIVE

## 2022-09-15 NOTE — Progress Notes (Signed)
   NURSE VISIT- VAGINITIS/STD/POC  SUBJECTIVE:  Tonya Mclaughlin is a 21 y.o. G0P0000 GYN patientfemale here for a vaginal swab for vaginitis/STD screening.  She reports the following symptoms: discharge described as white for 1 week. Denies abnormal vaginal bleeding, significant pelvic pain, fever, or UTI symptoms. Patient requested urine pregnancy test also. Results are negative.  OBJECTIVE:  BP 116/61 (BP Location: Right Arm, Patient Position: Sitting, Cuff Size: Normal)   Pulse 78   Ht 5\' 2"  (1.575 m)   Wt 152 lb (68.9 kg)   BMI 27.80 kg/m   Appears well, in no apparent distress  ASSESSMENT: Vaginal swab for STD/vaginitis screening.  PLAN: Self-collected vaginal probe for Gonorrhea, Chlamydia, Trichomonas, Bacterial Vaginosis, Yeast sent to lab Treatment: to be determined once results are received Follow-up as needed if symptoms persist/worsen, or new symptoms develop   09/15/2022 4:37 PM

## 2022-09-17 LAB — CERVICOVAGINAL ANCILLARY ONLY
Bacterial Vaginitis (gardnerella): POSITIVE — AB
Candida Glabrata: NEGATIVE
Candida Vaginitis: POSITIVE — AB
Chlamydia: NEGATIVE
Comment: NEGATIVE
Comment: NEGATIVE
Comment: NEGATIVE
Comment: NEGATIVE
Comment: NEGATIVE
Comment: NORMAL
Neisseria Gonorrhea: NEGATIVE
Trichomonas: NEGATIVE

## 2022-09-22 ENCOUNTER — Other Ambulatory Visit: Payer: Self-pay | Admitting: Advanced Practice Midwife

## 2022-09-22 DIAGNOSIS — N760 Acute vaginitis: Secondary | ICD-10-CM

## 2022-09-22 DIAGNOSIS — B3731 Acute candidiasis of vulva and vagina: Secondary | ICD-10-CM

## 2022-09-22 MED ORDER — METRONIDAZOLE 500 MG PO TABS: 500.0000 mg | ORAL_TABLET | Freq: Two times a day (BID) | ORAL | 0 refills | Status: DC

## 2022-09-22 MED ORDER — FLUCONAZOLE 150 MG PO TABS: ORAL_TABLET | ORAL | 2 refills | Status: DC

## 2022-09-30 ENCOUNTER — Other Ambulatory Visit (HOSPITAL_COMMUNITY)
Admission: RE | Admit: 2022-09-30 | Discharge: 2022-09-30 | Disposition: A | Discharge status: Home / Self Care | Payer: 59 | Source: Ambulatory Visit | Attending: Adult Health | Admitting: Adult Health

## 2022-09-30 ENCOUNTER — Encounter: Payer: Self-pay | Admitting: Adult Health

## 2022-09-30 ENCOUNTER — Ambulatory Visit (INDEPENDENT_AMBULATORY_CARE_PROVIDER_SITE_OTHER): Payer: 59 | Admitting: Adult Health

## 2022-09-30 VITALS — BP 104/63 | HR 72 | Ht 62.0 in | Wt 149.4 lb

## 2022-09-30 DIAGNOSIS — N898 Other specified noninflammatory disorders of vagina: Secondary | ICD-10-CM | POA: Diagnosis not present

## 2022-09-30 DIAGNOSIS — Z113 Encounter for screening for infections with a predominantly sexual mode of transmission: Secondary | ICD-10-CM | POA: Diagnosis not present

## 2022-09-30 DIAGNOSIS — R300 Dysuria: Secondary | ICD-10-CM | POA: Diagnosis not present

## 2022-09-30 LAB — POCT URINALYSIS DIPSTICK OB
Glucose, UA: NEGATIVE
Ketones, UA: NEGATIVE
Leukocytes, UA: NEGATIVE
Nitrite, UA: NEGATIVE
POC,PROTEIN,UA: NEGATIVE

## 2022-09-30 NOTE — Progress Notes (Signed)
  Subjective:     Patient ID: Tonya Mclaughlin, female   DOB: [birth year], 22 y.o.   MRN: [id number]  HPI Anaeli is a 22 year old white female, single, G0P0, in complaining of vaginal discharge and burning after peeing for 2-3 days. She was treated for BV and yeast in December.  Last pap was 11/28/20 NILM  PCP is Dr Nevada Crane.  Review of Systems +vaginal discharge for 2-3 days Burning after pees for 2-3 days but better    Reviewed past medical,surgical, social and family history. Reviewed medications and allergies.  Objective:   Physical Exam BP 104/63 (BP Location: Right Arm, Patient Position: Sitting, Cuff Size: Normal)   Pulse 72   Ht 5\' 2"  (1.575 m)   Wt 149 lb 6 oz (67.8 kg)   LMP 09/16/2022   BMI 27.32 kg/m     Urine dipstick trace blood Skin warm and dry.Pelvic: external genitalia is normal in appearance no lesions, vagina: white discharge without odor,urethra has no lesions or masses noted, cervix:smooth,uterus: normal size, shape and contour, non tender, no masses felt, adnexa: no masses or tenderness noted. Bladder is mildly tender and no masses felt. CV swab obtained. Fall risk is low  Upstream - 09/30/22 1542       Pregnancy Intention Screening   Does the patient want to become pregnant in the next year? No    Does the patient's partner want to become pregnant in the next year? No    Would the patient like to discuss contraceptive options today? No      Contraception Wrap Up   Current Method No Contraceptive Precautions    End Method No Contraception Precautions    Contraception Counseling Provided No            Examination chaperoned by Dewitt Hoes RN  Assessment:     1. Dysuria Burns after peeing but is better  Urine sent for UA C&S  2. Vaginal discharge +discharge for 2-3 days CV swab sent   3. Screen for STD (sexually transmitted disease) CV swab sent for GC/CHL/trich and BV,yeast   Plan:     Follow up prn

## 2022-10-01 LAB — URINALYSIS, ROUTINE W REFLEX MICROSCOPIC
Bilirubin, UA: NEGATIVE
Glucose, UA: NEGATIVE
Ketones, UA: NEGATIVE
Leukocytes,UA: NEGATIVE
Nitrite, UA: NEGATIVE
Protein,UA: NEGATIVE
RBC, UA: NEGATIVE
Specific Gravity, UA: 1.025 (ref 1.005–1.030)
Urobilinogen, Ur: 0.2 mg/dL (ref 0.2–1.0)
pH, UA: 7 (ref 5.0–7.5)

## 2022-10-02 LAB — URINE CULTURE

## 2022-10-04 LAB — CERVICOVAGINAL ANCILLARY ONLY
Bacterial Vaginitis (gardnerella): NEGATIVE
Candida Glabrata: NEGATIVE
Candida Vaginitis: NEGATIVE
Chlamydia: NEGATIVE
Comment: NEGATIVE
Comment: NEGATIVE
Comment: NEGATIVE
Comment: NEGATIVE
Comment: NEGATIVE
Comment: NORMAL
Neisseria Gonorrhea: NEGATIVE
Trichomonas: NEGATIVE

## 2022-10-07 DIAGNOSIS — E663 Overweight: Secondary | ICD-10-CM | POA: Diagnosis not present

## 2022-10-07 DIAGNOSIS — J00 Acute nasopharyngitis [common cold]: Secondary | ICD-10-CM | POA: Diagnosis not present

## 2022-10-07 DIAGNOSIS — Z6827 Body mass index (BMI) 27.0-27.9, adult: Secondary | ICD-10-CM | POA: Diagnosis not present

## 2022-12-14 ENCOUNTER — Encounter: Payer: Self-pay | Admitting: Dermatology

## 2022-12-14 ENCOUNTER — Ambulatory Visit (INDEPENDENT_AMBULATORY_CARE_PROVIDER_SITE_OTHER): Payer: 59 | Admitting: Dermatology

## 2022-12-14 ENCOUNTER — Other Ambulatory Visit: Payer: Self-pay

## 2022-12-14 DIAGNOSIS — L700 Acne vulgaris: Secondary | ICD-10-CM | POA: Diagnosis not present

## 2022-12-14 DIAGNOSIS — D235 Other benign neoplasm of skin of trunk: Secondary | ICD-10-CM

## 2022-12-14 DIAGNOSIS — D239 Other benign neoplasm of skin, unspecified: Secondary | ICD-10-CM

## 2022-12-14 DIAGNOSIS — D485 Neoplasm of uncertain behavior of skin: Secondary | ICD-10-CM

## 2022-12-14 HISTORY — DX: Other benign neoplasm of skin, unspecified: D23.9

## 2022-12-14 MED ORDER — CLINDAMYCIN PHOSPHATE 1 % EX SWAB: 1.0000 | Freq: Two times a day (BID) | CUTANEOUS | 3 refills | Status: AC

## 2022-12-14 MED ORDER — ARAZLO 0.045 % EX LOTN: 1.0000 | TOPICAL_LOTION | Freq: Every evening | CUTANEOUS | 3 refills | Status: AC

## 2022-12-14 NOTE — Patient Instructions (Signed)
Patient Handout: Wound Care for Skin Biopsy Site  Patient Handout: Wound Care for Skin Biopsy Site  Taking Care of Your Skin Biopsy Site  Proper care of the biopsy site is essential for promoting healing and minimizing scarring. This handout provides instructions on how to care for your biopsy site to ensure optimal recovery.  1. Cleaning the Wound:  Clean the biopsy site daily with gentle soap and water. Gently pat the area dry with a clean, soft towel. Avoid harsh scrubbing or rubbing the area, as this can irritate the skin and delay healing.  2. Applying Aquaphor and Bandage:  After cleaning the wound, apply a thin layer of Aquaphor ointment to the biopsy site. Cover the area with a sterile bandage to protect it from dirt, bacteria, and friction. Change the bandage daily or as needed if it becomes soiled or wet.  3. Continued Care for One Week:  Repeat the cleaning, Aquaphor application, and bandaging process daily for one week following the biopsy procedure. Keeping the wound clean and moist during this initial healing period will help prevent infection and promote optimal healing.  4. Massaging Aquaphor into the Area:  ---After one week, discontinue the use of bandages but continue to apply Aquaphor to the biopsy site. ----Gently massage the Aquaphor into the area using circular motions. ---Massaging the skin helps to promote circulation and prevent the formation of scar tissue.   Additional Tips:  Avoid exposing the biopsy site to direct sunlight during the healing process, as this can cause hyperpigmentation or worsen scarring. If you experience any signs of infection, such as increased redness, swelling, warmth, or drainage from the wound, contact your healthcare provider immediately. Follow any additional instructions provided by your healthcare provider for caring for the biopsy site and managing any discomfort. Conclusion:  Taking proper care of your skin biopsy site  is crucial for ensuring optimal healing and minimizing scarring. By following these instructions for cleaning, applying Aquaphor, and massaging the area, you can promote a smooth and successful recovery. If you have any questions or concerns about caring for your biopsy site, don't hesitate to contact your healthcare provider for guidance.    Daily Regimen Template:  Morning: 1. Wash your face with a gentle cleanser (Cetaphil, CeraVe, Neutrogena) 2. Apply of  CLINDAMYCIN 1% SWABS to face 3. Follow with a moisturizer and sunscreen suitable for acne-prone skin.  Evening: 1. Wash your face with a gentle cleanser (Cetaphil, CeraVe, Neutrogena) 2. Apply Vichy Mineral 89 Serum 3. Apply a pea-sized amount of  ARAZLO 0.045% LOTION  to entire face.  Start only using 2-3 night per week and gradually increase as tolerated. 4. Apply a non-comedogenic moisturizer to keep the skin hydrated overnight (Neutrogena, CeraVe, Cetaphil)  Note: Always follow your dermatologist's recommendations and treatment plan for best results. Consistency is key to managing acne effectively. If you experience any severe side effects or worsening of symptoms, consult your healthcare provider promptly.

## 2022-12-14 NOTE — Progress Notes (Signed)
   New Patient Visit  Subjective  Tonya Mclaughlin is a 22 y.o. female who presents for the following: Nevus (Has 2 existing moles that were evaluated by outside derm office .  One on breast the other buttocks.  No symptoms or changes reported.  ) and Acne (Pt's had acne for a few years.  No prior rx creams.  Current regimen, La Roche Posay BPO, cleanser and moisturizer.  Also uses Clinique moisturizer.  C/o acne scars on back.  ).    Objective  Well appearing patient in no apparent distress; mood and affect are within normal limits.  A focused examination was performed including chest, face, gluteal cleft. Relevant physical exam findings are noted in the Assessment and Plan.  Mid Forehead Follicularly based papules and pustules with comedones    Left Breast Irregular brown papule  Gluteal Crease Irregular brown papule   Assessment & Plan  Acne vulgaris Mid Forehead  Counseling I counseled the patient regarding the following: Skin care: I discussed with the patient the impo/rtance of using cleansers, moisturizers and cosmetics that are non-comedogenic. Expectations: The patient is aware that it may take up to 2-3 months to see a 60-80% improvement of acne. Contact office if: Acne worsens or fails to improve despite months of treatment; patient develops new scars, significantly more nodules or cysts. I recommended the following:   Medication Counseling Retinoid Counseling: Patient advised to apply a pea-sized amount only at bedtime and wait 30 minutes after washing their face before applying. If too drying, patient may add a non-comedogenic moisturizer. The patient verbalized understanding of the proper use and possible adverse effects of retinoids. All of the patient's questions and concerns were addressed.   clindamycin (CLEOCIN T) 1 % SWAB - Mid Forehead Apply 1 Application topically 2 (two) times daily.  Tazarotene (ARAZLO) 0.045 % LOTN - Mid Forehead Apply 1 Application  topically at bedtime.  Neoplasm of uncertain behavior of skin (2) Left Breast  Skin / nail biopsy Type of biopsy: tangential   Informed consent: discussed and consent obtained   Timeout: patient name, date of birth, surgical site, and procedure verified   Procedure prep:  Patient was prepped and draped in usual sterile fashion Prep type:  Isopropyl alcohol Anesthesia: the lesion was anesthetized in a standard fashion   Anesthetic:  1% lidocaine w/ epinephrine 1-100,000 buffered w/ 8.4% NaHCO3 Instrument used: DermaBlade   Hemostasis achieved with: aluminum chloride   Outcome: patient tolerated procedure well   Post-procedure details: sterile dressing applied and wound care instructions given   Dressing type: petrolatum gauze and bandage    Specimen 1 - Surgical pathology Differential Diagnosis: DN  Check Margins: No  Gluteal Crease  Skin / nail biopsy Type of biopsy: tangential   Informed consent: discussed and consent obtained   Timeout: patient name, date of birth, surgical site, and procedure verified   Procedure prep:  Patient was prepped and draped in usual sterile fashion Prep type:  Isopropyl alcohol Anesthesia: the lesion was anesthetized in a standard fashion   Anesthetic:  1% lidocaine w/ epinephrine 1-100,000 buffered w/ 8.4% NaHCO3 Instrument used: DermaBlade   Hemostasis achieved with: aluminum chloride   Outcome: patient tolerated procedure well   Post-procedure details: sterile dressing applied and wound care instructions given   Dressing type: petrolatum gauze and bandage    Skin / nail biopsy  Specimen 2 - Surgical pathology Differential Diagnosis: DN  Check Margins: No   No follow-ups on file.

## 2023-01-07 NOTE — Progress Notes (Signed)
    Hello Rochelle,  Your biopsy results showed that the mole removed for the breast area was atypical but was completely excised thus no additional treatment is required.  The mole removed from the gluteal crease was also abnormal however will require a small in office surgery for complete removal.   Someone from our team will be calling you to discuss the result sin detail and schedule your surgery.  Please call only if you have not received a phone call from our office within 1 week of receiving this message.   Kind regards,  Dr. Onalee Hua

## 2023-01-21 ENCOUNTER — Encounter: Payer: Self-pay | Admitting: Adult Health

## 2023-01-21 ENCOUNTER — Ambulatory Visit (INDEPENDENT_AMBULATORY_CARE_PROVIDER_SITE_OTHER): Payer: 59 | Admitting: Adult Health

## 2023-01-21 ENCOUNTER — Other Ambulatory Visit (HOSPITAL_BASED_OUTPATIENT_CLINIC_OR_DEPARTMENT_OTHER): Payer: Self-pay

## 2023-01-21 VITALS — BP 122/77 | HR 78 | Ht 62.0 in | Wt 150.5 lb

## 2023-01-21 DIAGNOSIS — Z3202 Encounter for pregnancy test, result negative: Secondary | ICD-10-CM | POA: Insufficient documentation

## 2023-01-21 DIAGNOSIS — Z30013 Encounter for initial prescription of injectable contraceptive: Secondary | ICD-10-CM | POA: Diagnosis not present

## 2023-01-21 LAB — POCT URINE PREGNANCY: Preg Test, Ur: NEGATIVE

## 2023-01-21 MED ORDER — MEDROXYPROGESTERONE ACETATE 150 MG/ML IM SUSP
150.0000 mg | INTRAMUSCULAR | 4 refills | Status: DC
  Filled 2023-01-21: qty 1, 84d supply, fill #0

## 2023-01-21 MED ORDER — MEDROXYPROGESTERONE ACETATE 150 MG/ML IM SUSY
150.0000 mg | PREFILLED_SYRINGE | Freq: Once | INTRAMUSCULAR | Status: AC
  Administered 2023-01-21: 150 mg via INTRAMUSCULAR

## 2023-01-21 NOTE — Progress Notes (Signed)
  Subjective:     Patient ID: Tonya Mclaughlin, female   DOB: 19-Dec-2000, 22 y.o.   MRN: [id number]  HPI Bonny is a 22 year old white female, single, G0P0, in to discuss getting back on depo, has used in the past.  Last pap was negative 11/28/20.  PCP is Dr Margo Aye   Review of Systems LMP 3/39/24 No sex in over 2 weeks    Reviewed past medical,surgical, social and family history. Reviewed medications and allergies.  Objective:   Physical Exam BP 122/77 (BP Location: Left Arm, Patient Position: Sitting, Cuff Size: Normal)   Pulse 78   Ht 5\' 2"  (1.575 m)   Wt 150 lb 8 oz (68.3 kg)   LMP 12/24/2022 (Approximate)   BMI 27.53 kg/m  UPT is negative Skin warm and dry.  Lungs: clear to ausculation bilaterally. Cardiovascular: regular rate and rhythm.    Fall risk is low  Upstream - 01/21/23 1046       Pregnancy Intention Screening   Does the patient want to become pregnant in the next year? No    Does the patient's partner want to become pregnant in the next year? No    Would the patient like to discuss contraceptive options today? No      Contraception Wrap Up   Current Method Withdrawal or Other Method    End Method Hormonal Injection    Contraception Counseling Provided Yes    How was the end contraceptive method provided? Provided on site   rx sent            Assessment:     1. Pregnancy examination or test, negative result  2. Encounter for initial prescription of injectable contraceptive Will give first injection of depo now in office and send rx  Meds ordered this encounter  Medications   medroxyPROGESTERone (DEPO-PROVERA) 150 MG/ML injection    Sig: Inject 1 mL (150 mg total) into the muscle every 3 (three) months.    Dispense:  1 mL    Refill:  4    Order Specific Question:   Supervising Provider    Answer:   Duane Lope H [[id number]]   medroxyPROGESTERone Acetate SUSY 150 mg        Plan:     Return in 12 weeks for depo

## 2023-01-28 ENCOUNTER — Other Ambulatory Visit (HOSPITAL_BASED_OUTPATIENT_CLINIC_OR_DEPARTMENT_OTHER): Payer: Self-pay

## 2023-02-10 ENCOUNTER — Other Ambulatory Visit (HOSPITAL_BASED_OUTPATIENT_CLINIC_OR_DEPARTMENT_OTHER): Payer: Self-pay

## 2023-02-10 ENCOUNTER — Other Ambulatory Visit: Payer: Self-pay | Admitting: Dermatology

## 2023-02-10 MED ORDER — CLOBETASOL PROPIONATE 0.05 % EX CREA
1.0000 | TOPICAL_CREAM | Freq: Two times a day (BID) | CUTANEOUS | 0 refills | Status: AC
  Filled 2023-02-10: qty 30, 30d supply, fill #0

## 2023-02-10 NOTE — Progress Notes (Signed)
Arthropod Bites:  Treatment Plan: Clobetasol Cream BID for 2 weeks

## 2023-02-18 ENCOUNTER — Encounter: Payer: Self-pay | Admitting: *Deleted

## 2023-03-15 ENCOUNTER — Other Ambulatory Visit (HOSPITAL_COMMUNITY): Payer: Self-pay

## 2023-04-05 ENCOUNTER — Other Ambulatory Visit: Payer: Self-pay

## 2023-04-05 ENCOUNTER — Encounter: Payer: Self-pay | Admitting: Emergency Medicine

## 2023-04-05 ENCOUNTER — Ambulatory Visit
Admission: EM | Admit: 2023-04-05 | Discharge: 2023-04-05 | Disposition: A | Discharge status: Home / Self Care | Payer: 59 | Attending: Family Medicine | Admitting: Family Medicine

## 2023-04-05 DIAGNOSIS — J069 Acute upper respiratory infection, unspecified: Secondary | ICD-10-CM | POA: Diagnosis not present

## 2023-04-05 DIAGNOSIS — Z1152 Encounter for screening for COVID-19: Secondary | ICD-10-CM | POA: Diagnosis not present

## 2023-04-05 LAB — POCT INFLUENZA A/B
Influenza A, POC: NEGATIVE
Influenza B, POC: NEGATIVE

## 2023-04-05 NOTE — ED Triage Notes (Signed)
Pt reports generalized body aches, lower back pain, and post nasal drip for last several days. Denies any known fevers.

## 2023-04-05 NOTE — Discharge Instructions (Signed)
Flonase and Sudafed as well as antihistamines may help with the postnasal drainage symptoms. Ibuprofen and Tylenol for the body aches.

## 2023-04-05 NOTE — ED Provider Notes (Signed)
[facility]    CSN: [id number] Arrival date & time: 04/05/23  1529      History   Chief Complaint Chief Complaint  Patient presents with  . Nasal Congestion    HPI Tonya Mclaughlin is a 22 y.o. female.   Patient presenting today with 2-day history of generalized bodyaches, lower back aching, postnasal drainage, scratchy throat.  Denies cough, chest pain, shortness of breath, abdominal pain, nausea vomiting or diarrhea.  So far not tried anything over-the-counter for symptoms.  Was around somebody with similar symptoms over the weekend.  Does have a history of seasonal allergies and asthma not currently on regimen for this.   Past Medical History:  Diagnosis Date  . ADHD (attention deficit hyperactivity disorder)   . Asthma   . Constipation   . Dysplastic nevus 12/14/2022   Leftr breast - moderate  . Dysplastic nevus 12/14/2022   Gluteal crease - moderate  . Irregular bleeding 02/13/2015  . Migraine     Patient Active Problem List   Diagnosis Date Noted  . Encounter for initial prescription of injectable contraceptive 01/21/2023  . Pregnancy examination or test, negative result 01/21/2023  . Vaginal discharge 09/30/2022  . Dysuria 09/30/2022  . Screen for STD (sexually transmitted disease) 09/30/2022  . IBS (irritable bowel syndrome) 12/14/2016  . Abdominal pain, left lower quadrant 09/17/2016  . Irregular bleeding 02/13/2015  . Migraine headache 05/08/2014  . 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 No pertinent surgical history.  OB History     Gravida  0   Para  0   Term  0   Preterm  0   AB  0   Living  0      SAB  0   IAB  0   Ectopic  0   Multiple  0   Live Births               Home Medications    Prior to Admission medications   Medication Sig Start Date End Date Taking? Authorizing Provider  albuterol (PROVENTIL) (2.5 MG/3ML) 0.083% nebulizer solution Take 3 mLs  (2.5 mg total) by nebulization every 6 (six) hours as needed for wheezing or shortness of breath. 07/30/20   Elenore Paddy, NP  Albuterol Sulfate (PROAIR RESPICLICK) 108 (90 Base) MCG/ACT AEPB Inhale 1-2 puffs into the lungs every 6 (six) hours as needed. 07/21/21   Theadora Rama Scales, PA-C  clindamycin (CLEOCIN T) 1 % SWAB Apply 1 Application topically 2 (two) times daily. 12/14/22   Terri Piedra, DO  medroxyPROGESTERone (DEPO-PROVERA) 150 MG/ML injection Inject 1 mL (150 mg total) into the muscle every 3 (three) months. 01/21/23   Adline Potter, NP  methocarbamol (ROBAXIN) 750 MG tablet Take 750 mg by mouth every 8 (eight) hours as needed. For back spasm . 05/20/20   [provider]  silver sulfADIAZINE (SILVADENE) 1 % cream Apply 1 application. topically daily. 01/11/22   Johny Drilling, DO  Tazarotene (ARAZLO) 0.045 % LOTN Apply 1 Application topically at bedtime. 12/14/22   Terri Piedra, DO    Family History Family History  Problem Relation Age of Onset  . Hypertension Mother   . Migraines Mother     Social History Social History   Tobacco Use  . Smoking status: Never  . Smokeless tobacco: Never  Vaping Use  . Vaping Use: Never used  Substance Use Topics  . Alcohol use: Yes    Comment:  occ  . Drug use: No     Allergies   Penicillins   Review of Systems Review of Systems Per HPI  Physical Exam Triage Vital Signs ED Triage Vitals  Enc Vitals Group     BP 04/05/23 1538 131/84     Pulse Rate 04/05/23 1538 78     Resp 04/05/23 1538 20     Temp 04/05/23 1538 98.3 F (36.8 C)     Temp Source 04/05/23 1538 Oral     SpO2 04/05/23 1538 96 %     Weight --      Height --      Head Circumference --      Peak Flow --      Pain Score 04/05/23 1539 5     Pain Loc --      Pain Edu? --      Excl. in GC? --    No data found.  Updated Vital Signs BP 131/84 (BP Location: Right Arm)   Pulse 78   Temp 98.3 F (36.8 C) (Oral)   Resp 20   SpO2  96%   Visual Acuity Right Eye Distance:   Left Eye Distance:   Bilateral Distance:    Right Eye Near:   Left Eye Near:    Bilateral Near:     Physical Exam Vitals and nursing note reviewed.  Constitutional:      Appearance: Normal appearance. She is not ill-appearing.  HENT:     Head: Atraumatic.     Nose: Nose normal.     Mouth/Throat:     Mouth: Mucous membranes are moist.     Comments: Posterior oropharyngeal erythema and postnasal drainage pattern Eyes:     Extraocular Movements: Extraocular movements intact.     Conjunctiva/sclera: Conjunctivae normal.  Cardiovascular:     Rate and Rhythm: Normal rate and regular rhythm.     Heart sounds: Normal heart sounds.  Pulmonary:     Effort: Pulmonary effort is normal.     Breath sounds: Normal breath sounds. No wheezing or rales.  Musculoskeletal:        General: Normal range of motion.     Cervical back: Normal range of motion and neck supple.  Skin:    General: Skin is warm and dry.  Neurological:     Mental Status: She is alert and oriented to person, place, and time.  Psychiatric:        Mood and Affect: Mood normal.        Thought Content: Thought content normal.        Judgment: Judgment normal.     UC Treatments / Results  Labs (all labs ordered are listed, but only abnormal results are displayed) Labs Reviewed  SARS CORONAVIRUS 2 (TAT 6-24 HRS)  POCT INFLUENZA A/B    EKG   Radiology No results found.  Procedures Procedures (including critical care time)  Medications Ordered in UC Medications - No data to display  Initial Impression / Assessment and Plan / UC Course  I have reviewed the triage vital signs and the nursing notes.  Pertinent labs & imaging results that were available during my care of the patient were reviewed by me and considered in my medical decision making (see chart for details).     Vitals and exam overall reassuring, suspect viral etiology of symptoms.  Rapid flu  negative, COVID testing pending.  Discussed supportive over-the-counter medications, home care and return precautions  Final Clinical Impressions(s) / UC Diagnoses  Final diagnoses:  Viral URI   Discharge Instructions   None    ED Prescriptions   None    PDMP not reviewed this encounter.   Particia Nearing, [place] 04/05/23 1717

## 2023-04-06 LAB — SARS CORONAVIRUS 2 (TAT 6-24 HRS): SARS Coronavirus 2: NEGATIVE

## 2023-04-14 ENCOUNTER — Other Ambulatory Visit: Payer: Self-pay

## 2023-04-15 ENCOUNTER — Ambulatory Visit (INDEPENDENT_AMBULATORY_CARE_PROVIDER_SITE_OTHER): Payer: 59 | Admitting: *Deleted

## 2023-04-15 DIAGNOSIS — M25512 Pain in left shoulder: Secondary | ICD-10-CM | POA: Diagnosis not present

## 2023-04-15 DIAGNOSIS — M542 Cervicalgia: Secondary | ICD-10-CM | POA: Diagnosis not present

## 2023-04-15 DIAGNOSIS — Z3042 Encounter for surveillance of injectable contraceptive: Secondary | ICD-10-CM | POA: Diagnosis not present

## 2023-04-15 MED ORDER — MEDROXYPROGESTERONE ACETATE 150 MG/ML IM SUSP
150.0000 mg | Freq: Once | INTRAMUSCULAR | Status: AC
  Administered 2023-04-15: 150 mg via INTRAMUSCULAR

## 2023-04-15 NOTE — Progress Notes (Signed)
   NURSE VISIT- INJECTION  SUBJECTIVE:  Tonya Mclaughlin is a 22 y.o. G0P0000 female here for a Depo Provera for contraception/period management. She is a GYN patient.   OBJECTIVE:  There were no vitals taken for this visit.  Appears well, in no apparent distress  Injection administered in: Left deltoid  Meds ordered this encounter  Medications   medroxyPROGESTERone (DEPO-PROVERA) injection 150 mg    ASSESSMENT: GYN patient Depo Provera for contraception/period management PLAN: Follow-up: in 11-13 weeks for next Depo   Annamarie Dawley  04/15/2023 10:14 AM

## 2023-04-22 ENCOUNTER — Other Ambulatory Visit (HOSPITAL_BASED_OUTPATIENT_CLINIC_OR_DEPARTMENT_OTHER): Payer: Self-pay

## 2023-04-28 ENCOUNTER — Other Ambulatory Visit (HOSPITAL_BASED_OUTPATIENT_CLINIC_OR_DEPARTMENT_OTHER): Payer: Self-pay

## 2023-04-29 ENCOUNTER — Ambulatory Visit (HOSPITAL_BASED_OUTPATIENT_CLINIC_OR_DEPARTMENT_OTHER): Payer: 59 | Admitting: Family Medicine

## 2023-05-05 NOTE — Progress Notes (Signed)
New Patient Office Visit  Subjective:   Tonya Mclaughlin 05-19-2001 05/06/2023  Chief Complaint  Patient presents with   New Patient (Initial Visit)    Pt is here to get established with the practice. Pt is wanting to know if her ADHD medications can be managed by PCP due to having problems getting an appt scheduled with ADHD provider's office.    HPI: Tonya Mclaughlin presents today to establish care at [facility] at [facility] Introduced to Publishing rights manager role and practice setting.  All questions answered.   Last PCP: Tonya Mclaughlin Last annual physical: January 2023 Concerns: See below   ADD/ADHD EVALUATION Tonya Mclaughlin presents for the evaluation of ADD/ADHD.   She has been on Concerta and well controlled in the past. She also took Vyvanse in the past and reports she wasn't eating due to the medication with appetite suppression and working out frequently.   She has not taken Vyvanse in the past few years due to no school work or focus needed. She is going back to school for LPN and concerned regarding focusing and work completion.   Work/school performance:  average Difficulty sustaining attention/completing tasks: yes Distracted by extraneous stimuli: yes Does not listen when spoken to: no  Fidgets with hands or feet: yes Unable to stay in seat: no Blurts out/interrupts others: no  Previous psychiatry evaluation: no, PCP managed  Previous medications: yes concerta and vyvanse (lisdexamfethamine)      WEIGHT CONCERN:  She states prior to [facility] she had heavy period and they were irregular in length. She states her weight is difficult to get off from midsection regardless of exercise and diet. Reports working out 4-5 days per week. She reports cold in hands and feet. Would like iron and vitamin levels checked.     The following portions of the patient's history were reviewed and updated as appropriate: past medical history, past surgical  history, family history, social history, allergies, medications, and problem list.   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   Past Medical History:  Diagnosis Date   ADHD (attention deficit hyperactivity disorder)    Asthma    Constipation    Dysplastic nevus 12/14/2022   Leftr breast - moderate   Dysplastic nevus 12/14/2022   Gluteal crease - moderate   Irregular bleeding 02/13/2015   Migraine    Past Surgical History:  Procedure Laterality Date   WISDOM TOOTH EXTRACTION     Family History  Problem Relation Age of Onset   Hypertension Mother    Migraines Mother    Social History   Socioeconomic History   Marital status: Single    Spouse name: Not on file   Number of children: Not on file   Years of education: 13   Highest education level: Not on file  Occupational History   Occupation: student  Tobacco Use   Smoking status: Never   Smokeless tobacco: Never  Vaping Use   Vaping status:  Never Used  Substance and Sexual Activity   Alcohol use: Yes    Comment: occ   Drug use: No   Sexual activity: Yes    Birth control/protection: None, Other-see comments, Injection    Comment: pull out  Other Topics Concern   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   Social  Determinants of Health   Financial Resource Strain: Low Risk  (11/28/2020)   Overall Financial Resource Strain (CARDIA)    Difficulty of Paying Living Expenses: Not hard at all  Food Insecurity: No Food Insecurity (11/28/2020)   Hunger Vital Sign    Worried About Running Out of Food in the Last Year: Never true    Ran Out of Food in the Last Year: Never true  Transportation Needs: No Transportation Needs (11/28/2020)   PRAPARE - Administrator, Civil Service (Medical): No    Lack of Transportation (Non-Medical): No  Physical Activity: Sufficiently Active (11/28/2020)   Exercise Vital Sign    Days of Exercise per Week: 3 days    Minutes of Exercise per Session: 90 min  Stress: Stress Concern Present (11/28/2020)   [employer] of Occupational Health - Occupational Stress Questionnaire    Feeling of Stress : To some extent  Social Connections: Moderately Isolated (11/28/2020)   Social Connection and Isolation Panel [NHANES]    Frequency of Communication with Friends and Family: Twice a week    Frequency of Social Gatherings with Friends and Family: More than three times a week    Attends Religious Services: 1 to 4 times per year    Active Member of [employer] or Organizations: No    Attends Banker Meetings: Never    Marital Status: Never married  Intimate Partner Violence: Not At Risk (11/28/2020)   Humiliation, Afraid, Rape, and Kick questionnaire    Fear of Current or Ex-Partner: No    Emotionally Abused: No    Physically Abused: No    Sexually Abused: No   Outpatient Medications Prior to Visit  Medication Sig Dispense Refill   albuterol (PROVENTIL) (2.5 MG/3ML) 0.083% nebulizer solution Take 3 mLs (2.5 mg total) by nebulization every 6 (six) hours as needed for wheezing or shortness of breath. 150 mL 1   Albuterol Sulfate (PROAIR RESPICLICK) 108 (90 Base) MCG/ACT AEPB Inhale 1-2 puffs into the lungs every 6 (six) hours as needed. 1 each 0   clindamycin (CLEOCIN T) 1  % SWAB Apply 1 Application topically 2 (two) times daily. 60 each 3   medroxyPROGESTERone (DEPO-PROVERA) 150 MG/ML injection Inject 1 mL (150 mg total) into the muscle every 3 (three) months. 1 mL 4   methocarbamol (ROBAXIN) 750 MG tablet Take 750 mg by mouth every 8 (eight) hours as needed. For back spasm .     silver sulfADIAZINE (SILVADENE) 1 % cream Apply 1 application. topically daily. 50 g 0   Tazarotene (ARAZLO) 0.045 % LOTN Apply 1 Application topically at bedtime. 45 g 3   No facility-administered medications prior to visit.   Allergies  Allergen Reactions   Penicillins     Doesn't work for her    ROS: A complete ROS was performed with pertinent positives/negatives noted in the HPI. The remainder of the ROS are negative.   Objective:   Today's Vitals   05/06/23 0958  BP: 130/82  Pulse:  71  SpO2: 100%  Weight: 150 lb 8 oz (68.3 kg)  Height: 5\' 2"  (1.575 m)    GENERAL: Well-appearing, in NAD. Well nourished.  SKIN: Pink, warm and dry. No rash, lesion, ulceration, or ecchymoses.  Head: Normocephalic. NECK: Trachea midline. Full ROM w/o pain or tenderness. No lymphadenopathy.  RESPIRATORY: Chest wall symmetrical. Respirations even and non-labored. Breath sounds clear to auscultation bilaterally.  CARDIAC: S1, S2 present, regular rate and rhythm without murmur or gallops. Peripheral pulses 2+ bilaterally.  MSK: Muscle tone and strength appropriate for age. Joints w/o tenderness, redness, or swelling.  EXTREMITIES: Without clubbing, cyanosis, or edema.  NEUROLOGIC: No motor or sensory deficits. Steady, even gait. C2-C12 intact.  PSYCH/MENTAL STATUS: Alert, oriented x 3. Cooperative, appropriate mood and affect.   EKG: normal EKG, normal sinus rhythm, unchanged from previous tracings.   Results for orders placed or performed in visit on 05/06/23  POCT Urine Drug Screen  Result Value Ref Range   POC Methamphetamine UR None Detected None Detected   POC Opiate Ur None  Detected None Detected   POC Barbiturate UR None Detected None Detected   POC Amphetamine UR None Detected None Detected   POC Oxycodone UR None Detected None Detected   POC Cocaine UR None Detected None Detected   POC Ecstasy UR None Detected None Detected   POC TRICYCLICS UR None Detected None Detected   POC PHENCYCLIDINE UR None Detected None Detected   POC Marijuana UR None Detected None Detected   POC Methadone UR None Detected None Detected   POC BENZODIAZEPINES UR None Detected None Detected   URINE TEMPERATURE     POC DRUG SCREEN OXIDANTS URINE     POC SPECIFIC GRAVITY URINE     POC PH URINE     Methylenedioxyamphetamine         Assessment & Plan:  1. Encounter to establish care with new doctor Discussed role of PCP and need for upcoming AE. Will scheduled and obtain fasting labs. Will update preventative screenings.   2. Attention deficit hyperactivity disorder (ADHD), combined type Pt tolerated Concerta well in the past with extended release but noticed medication not active by afternoon/evening. Will start Ritalin 10mg  24hr capsule daily and only use Ritalin IR 5mg -10mg  PRN in afternoon if needed while returning to school this fall for focusing and task completion. UDS and controlled substance agreement completed. Will follow up in 4 weeks for titration if needed.   EKG normal, no signs of arrhythmia.  - EKG 12-Lead - methylphenidate (RITALIN LA) 10 MG 24 hr capsule; Take 1 capsule (10 mg total) by mouth daily.  Dispense: 30 capsule; Refill: 0 - methylphenidate (RITALIN) 10 MG tablet; Take 1/2 to 1 tablet by mouth as needed in the afternoon for focus.  Dispense: 30 tablet; Refill: 0 - POCT Urine Drug Screen  3. Weight gain Will assess with fasting labs to check for thyroid and iron deficiency, A1c for prediabetes. No signs of hirsutism or hyperandrogenism per patient.   - Iron, TIBC and Ferritin Panel; Future - Vitamin B12; Future - VITAMIN D 25 Hydroxy (Vit-D  Deficiency, Fractures); Future - Lipid panel; Future - Comprehensive metabolic panel; Future - CBC with Differential/Platelet; Future - Hemoglobin A1c; Future - TSH; Future   Patient to reach out to office if new, worrisome, or unresolved symptoms arise or if no improvement in patient's condition. Patient verbalized understanding and is agreeable to treatment plan. All questions answered to patient's satisfaction.    Return in about 4  weeks (around 06/03/2023) for ANNUAL PHYSICAL and ADHD check up .   Of note, portions of this note may have been created with voice recognition software Physicist, medical). While this note has been edited for accuracy, occasional wrong-word or 'sound-a-like' substitutions may have occurred due to the inherent limitations of voice recognition software.  Yolanda Manges, FNP

## 2023-05-06 ENCOUNTER — Ambulatory Visit (INDEPENDENT_AMBULATORY_CARE_PROVIDER_SITE_OTHER): Payer: 59 | Admitting: Family Medicine

## 2023-05-06 ENCOUNTER — Encounter (HOSPITAL_BASED_OUTPATIENT_CLINIC_OR_DEPARTMENT_OTHER): Payer: Self-pay | Admitting: Family Medicine

## 2023-05-06 VITALS — BP 130/82 | HR 71 | Ht 62.0 in | Wt 150.5 lb

## 2023-05-06 DIAGNOSIS — F902 Attention-deficit hyperactivity disorder, combined type: Secondary | ICD-10-CM | POA: Diagnosis not present

## 2023-05-06 DIAGNOSIS — Z7689 Persons encountering health services in other specified circumstances: Secondary | ICD-10-CM

## 2023-05-06 DIAGNOSIS — R635 Abnormal weight gain: Secondary | ICD-10-CM | POA: Diagnosis not present

## 2023-05-06 LAB — POCT URINE DRUG SCREEN
POC Amphetamine UR: NOT DETECTED
POC BENZODIAZEPINES UR: NOT DETECTED
POC Barbiturate UR: NOT DETECTED
POC Cocaine UR: NOT DETECTED
POC Ecstasy UR: NOT DETECTED
POC Marijuana UR: NOT DETECTED
POC Methadone UR: NOT DETECTED
POC Methamphetamine UR: NOT DETECTED
POC Opiate Ur: NOT DETECTED
POC Oxycodone UR: NOT DETECTED
POC PHENCYCLIDINE UR: NOT DETECTED
POC TRICYCLICS UR: NOT DETECTED

## 2023-05-06 MED ORDER — METHYLPHENIDATE HCL 10 MG PO TABS
ORAL_TABLET | ORAL | 0 refills | Status: DC
Start: 2023-05-06 — End: 2023-06-22

## 2023-05-06 MED ORDER — METHYLPHENIDATE HCL ER (LA) 10 MG PO CP24
10.0000 mg | ORAL_CAPSULE | Freq: Every day | ORAL | 0 refills | Status: DC
Start: 2023-05-06 — End: 2023-06-22

## 2023-05-15 DIAGNOSIS — S62653A Nondisplaced fracture of medial phalanx of left middle finger, initial encounter for closed fracture: Secondary | ICD-10-CM | POA: Diagnosis not present

## 2023-05-15 DIAGNOSIS — S6992XA Unspecified injury of left wrist, hand and finger(s), initial encounter: Secondary | ICD-10-CM | POA: Diagnosis not present

## 2023-05-15 DIAGNOSIS — X58XXXA Exposure to other specified factors, initial encounter: Secondary | ICD-10-CM | POA: Diagnosis not present

## 2023-05-15 DIAGNOSIS — S62623A Displaced fracture of medial phalanx of left middle finger, initial encounter for closed fracture: Secondary | ICD-10-CM | POA: Diagnosis not present

## 2023-05-16 DIAGNOSIS — M79645 Pain in left finger(s): Secondary | ICD-10-CM | POA: Diagnosis not present

## 2023-05-16 DIAGNOSIS — S62629A Displaced fracture of medial phalanx of unspecified finger, initial encounter for closed fracture: Secondary | ICD-10-CM | POA: Insufficient documentation

## 2023-05-16 DIAGNOSIS — S62623A Displaced fracture of medial phalanx of left middle finger, initial encounter for closed fracture: Secondary | ICD-10-CM | POA: Diagnosis not present

## 2023-05-17 ENCOUNTER — Other Ambulatory Visit (HOSPITAL_BASED_OUTPATIENT_CLINIC_OR_DEPARTMENT_OTHER): Payer: Self-pay | Admitting: *Deleted

## 2023-05-17 DIAGNOSIS — R635 Abnormal weight gain: Secondary | ICD-10-CM | POA: Diagnosis not present

## 2023-05-18 LAB — LIPID PANEL
Chol/HDL Ratio: 2.1 ratio (ref 0.0–4.4)
Cholesterol, Total: 114 mg/dL (ref 100–199)
HDL: 54 mg/dL (ref >39)
LDL Chol Calc (NIH): 45 mg/dL (ref 0–99)
Triglycerides: 70 mg/dL (ref 0–149)
VLDL Cholesterol Cal: 15 mg/dL (ref 5–40)

## 2023-05-18 LAB — CBC WITH DIFFERENTIAL/PLATELET
Basophils Absolute: 0 10*3/uL (ref 0.0–0.2)
Basos: 1 %
EOS (ABSOLUTE): 0.1 10*3/uL (ref 0.0–0.4)
Eos: 2 %
Hematocrit: 38.6 % (ref 34.0–46.6)
Hemoglobin: 13.1 g/dL (ref 11.1–15.9)
Immature Grans (Abs): 0 10*3/uL (ref 0.0–0.1)
Immature Granulocytes: 0 %
Lymphocytes Absolute: 1.8 10*3/uL (ref 0.7–3.1)
Lymphs: 37 %
MCH: 32.5 pg (ref 26.6–33.0)
MCHC: 33.9 g/dL (ref 31.5–35.7)
MCV: 96 fL (ref 79–97)
Monocytes Absolute: 0.4 10*3/uL (ref 0.1–0.9)
Monocytes: 9 %
Neutrophils Absolute: 2.5 10*3/uL (ref 1.4–7.0)
Neutrophils: 51 %
Platelets: 296 10*3/uL (ref 150–450)
RBC: 4.03 x10E6/uL (ref 3.77–5.28)
RDW: 12 % (ref 11.7–15.4)
WBC: 4.8 10*3/uL (ref 3.4–10.8)

## 2023-05-18 LAB — COMPREHENSIVE METABOLIC PANEL
ALT: 21 IU/L (ref 0–32)
AST: 15 IU/L (ref 0–40)
Albumin: 4.6 g/dL (ref 4.0–5.0)
Alkaline Phosphatase: 40 IU/L — ABNORMAL LOW (ref 44–121)
BUN/Creatinine Ratio: 12 (ref 9–23)
BUN: 11 mg/dL (ref 6–20)
Bilirubin Total: 1.2 mg/dL (ref 0.0–1.2)
CO2: 21 mmol/L (ref 20–29)
Calcium: 9.7 mg/dL (ref 8.7–10.2)
Chloride: 105 mmol/L (ref 96–106)
Creatinine, Ser: 0.9 mg/dL (ref 0.57–1.00)
Globulin, Total: 2.2 g/dL (ref 1.5–4.5)
Glucose: 87 mg/dL (ref 70–99)
Potassium: 4.3 mmol/L (ref 3.5–5.2)
Sodium: 140 mmol/L (ref 134–144)
Total Protein: 6.8 g/dL (ref 6.0–8.5)
eGFR: 92 mL/min/{1.73_m2} (ref >59)

## 2023-05-18 LAB — IRON,TIBC AND FERRITIN PANEL
Ferritin: 39 ng/mL (ref 15–150)
Iron Saturation: 38 % (ref 15–55)
Iron: 122 ug/dL (ref 27–159)
Total Iron Binding Capacity: 325 ug/dL (ref 250–450)
UIBC: 203 ug/dL (ref 131–425)

## 2023-05-18 LAB — HEMOGLOBIN A1C
Est. average glucose Bld gHb Est-mCnc: 103 mg/dL
Hgb A1c MFr Bld: 5.2 % (ref 4.8–5.6)

## 2023-05-18 LAB — VITAMIN D 25 HYDROXY (VIT D DEFICIENCY, FRACTURES): Vit D, 25-Hydroxy: 37.8 ng/mL (ref 30.0–100.0)

## 2023-05-18 LAB — VITAMIN B12: Vitamin B-12: 293 pg/mL (ref 232–1245)

## 2023-05-18 LAB — TSH: TSH: 2.08 u[IU]/mL (ref 0.450–4.500)

## 2023-05-31 NOTE — Progress Notes (Deleted)
Subjective:   Tonya Mclaughlin 05/10/2001  06/01/2023   CC: No chief complaint on file.   HPI: Tonya Mclaughlin is a 22 y.o. female who presents for a routine health maintenance exam.  Labs collected at time of visit.   ADHD FOLLOW UP Patient presents for the medical management of ADD/ADHD.  Current medication regimen: *** Medication compliance:  {Blank single:19197::"excellent compliance","good compliance","fair compliance","poor compliance"} ADHD status: {Blank single:19197::"controlled","uncontrolled","better","worse","exacerbated","stable"} ADHD Medication Side Effects: {Blank single:19197::"yes","no"}    Decreased appetite: {Blank single:19197::"yes","no"}    Headache: {Blank single:19197::"yes","no"}    Sleeping disturbance pattern: {Blank single:19197::"yes","no"}    Irritability: {Blank single:19197::"yes","no"}    Rebound effects (worse than baseline) off medication: {Blank single:19197::"yes","no"}    Anxiousness: {Blank single:19197::"yes","no"}    Dizziness: {Blank single:19197::"yes","no"}    Tics: {Blank single:19197::"yes","no"}   Previous medications: {Blank single:19197::"yes","no"} {Blank multiple:19196::"adderall","adderall XR","concerta","daytrana (methylphenidate)","focalin (dexamethylphenidate)". "ritalin","ritalin LA","ritalin SR","stratera (atomoxetine)","vyvanse (lisdexamfethamine)","wellbutrin"}    Work/school performance:  {Blank single:19197::"excellent","good","average","fair","poor"} Difficulty sustaining attention/completing tasks: {Blank single:19197::"yes","no"} Distracted by extraneous stimuli: {Blank single:19197::"yes","no"} Does not listen when spoken to: {Blank single:19197::"yes","no"}  Fidgets with hands or feet: {Blank single:19197::"yes","no"} Unable to stay in seat: {Blank single:19197::"yes","no"} Blurts out/interrupts others: {Blank single:19197::"yes","no"}  Controlled substance contract: {Blank single:19197::"yes","no"}    HEALTH  SCREENINGS: - Vision Screening: {Blank single:19197::"pap done","not applicable","up to date","done elsewhere"} - Dental Visits: {Blank single:19197::"pap done","not applicable","up to date","done elsewhere"} - Pap smear: {Blank single:19197::"pap done","not applicable","up to date","done elsewhere"} - Breast Exam: {Blank single:19197::"pap done","not applicable","up to date","done elsewhere"} - STD Screening: {Blank single:19197::"Up to date","Ordered today","Not applicable","Declined","Done elsewhere"} - Mammogram (40+): {Blank single:19197::"Up to date","Ordered today","Not applicable","Refused","Done elsewhere"}  - Colonoscopy (45+): {Blank single:19197::"Up to date","Ordered today","Not applicable","Refused","Done elsewhere"}  - Bone Density (65+ or under 65 with predisposing conditions): {Blank single:19197::"Up to date","Ordered today","Not applicable","Refused","Done elsewhere"}  - Lung CA screening with low-dose CT:  {Blank single:19197::"Up to date","Ordered today","Not applicable","Declined","Done elsewhere"} Adults age 58-80 who are current cigarette smokers or quit within the last 15 years. Must have 20 pack year history.   Depression and Anxiety Screen done today and results listed below:     05/06/2023   10:03 AM 11/28/2020   12:48 PM 08/18/2020    5:25 PM 10/04/2017   11:39 AM 01/25/2017   10:46 AM  Depression screen PHQ 2/9  Decreased Interest 1 2 1  0 0  Down, Depressed, Hopeless 1 1 3  0 0  PHQ - 2 Score 2 3 4  0 0  Altered sleeping 1 1 3   0  Tired, decreased energy 1 3 3   0  Change in appetite 2 2 2   0  Feeling bad or failure about yourself  0 1 0  0  Trouble concentrating 1 1 3   0  Moving slowly or fidgety/restless 0 0 1  0  Suicidal thoughts 0 0 0  0  PHQ-9 Score 7 11 16   0  Difficult doing work/chores Somewhat difficult          05/06/2023   10:04 AM 11/28/2020   12:49 PM  GAD 7 : Generalized Anxiety Score  Nervous, Anxious, on Edge 2 2  Control/stop worrying 2 0   Worry too much - different things 1 1  Trouble relaxing 0 1  Restless 0 0  Easily annoyed or irritable 2 1  Afraid - awful might happen 1 0  Total GAD 7 Score 8 5  Anxiety Difficulty Somewhat difficult     IMMUNIZATIONS: - Tdap: Tetanus vaccination status reviewed: {tetanus status:315746}. - HPV: {Blank single:19197::"Up to date","Administered today","Not applicable","Refused","Given elsewhere"} - Influenza: {Blank  single:19197::"Up to date","Administered today","Postponed to flu season","Refused","Given elsewhere"} - Pneumovax: {Blank single:19197::"Up to date","Administered today","Not applicable","Refused","Given elsewhere"} - Prevnar 20: {Blank single:19197::"Up to date","Administered today","Not applicable","Refused","Given elsewhere"} - Zostavax (50+): {Blank single:19197::"Up to date","Administered today","Not applicable","Refused","Given elsewhere"}   Past medical history, surgical history, medications, allergies, family history and social history reviewed with patient today and changes made to appropriate areas of the chart.   Past Medical History:  Diagnosis Date   ADHD (attention deficit hyperactivity disorder)    Asthma    Constipation    Dysplastic nevus 12/14/2022   Leftr breast - moderate   Dysplastic nevus 12/14/2022   Gluteal crease - moderate   Irregular bleeding 02/13/2015   Migraine     Past Surgical History:  Procedure Laterality Date   WISDOM TOOTH EXTRACTION      Current Outpatient Medications on File Prior to Visit  Medication Sig   albuterol (PROVENTIL) (2.5 MG/3ML) 0.083% nebulizer solution Take 3 mLs (2.5 mg total) by nebulization every 6 (six) hours as needed for wheezing or shortness of breath.   Albuterol Sulfate (PROAIR RESPICLICK) 108 (90 Base) MCG/ACT AEPB Inhale 1-2 puffs into the lungs every 6 (six) hours as needed.   clindamycin (CLEOCIN T) 1 % SWAB Apply 1 Application topically 2 (two) times daily.   medroxyPROGESTERone (DEPO-PROVERA)  150 MG/ML injection Inject 1 mL (150 mg total) into the muscle every 3 (three) months.   methocarbamol (ROBAXIN) 750 MG tablet Take 750 mg by mouth every 8 (eight) hours as needed. For back spasm .   methylphenidate (RITALIN LA) 10 MG 24 hr capsule Take 1 capsule (10 mg total) by mouth daily.   methylphenidate (RITALIN) 10 MG tablet Take 1/2 to 1 tablet by mouth as needed in the afternoon for focus.   silver sulfADIAZINE (SILVADENE) 1 % cream Apply 1 application. topically daily.   Tazarotene (ARAZLO) 0.045 % LOTN Apply 1 Application topically at bedtime.   No current facility-administered medications on file prior to visit.    Allergies  Allergen Reactions   Penicillins     Doesn't work for her     Social History   Socioeconomic History   Marital status: Single    Spouse name: Not on file   Number of children: Not on file   Years of education: 13   Highest education level: Not on file  Occupational History   Occupation: student  Tobacco Use   Smoking status: Never   Smokeless tobacco: Never  Vaping Use   Vaping status: Never Used  Substance and Sexual Activity   Alcohol use: Yes    Comment: occ   Drug use: No   Sexual activity: Yes    Birth control/protection: None, Other-see comments, Injection    Comment: pull out  Other Topics Concern   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   Social Determinants of Health   Financial Resource Strain: Low Risk  (11/28/2020)   Overall Financial Resource Strain (CARDIA)    Difficulty of Paying Living Expenses: Not hard at all  Food Insecurity: No Food Insecurity (11/28/2020)   Hunger Vital Sign    Worried About Running Out of Food in the Last Year: Never true    Ran Out of Food in the Last Year: Never true  Transportation Needs: No Transportation Needs (11/28/2020)   PRAPARE - Transportation    Lack of  Transportation (Medical): No    Lack of Transportation (Non-Medical): No  Physical Activity: Sufficiently Active (11/28/2020)   Exercise Vital Sign    Days of Exercise per Week: 3 days    Minutes of Exercise per Session: 90 min  Stress: Stress Concern Present (11/28/2020)   [employer] of Occupational Health - Occupational Stress Questionnaire    Feeling of Stress : To some extent  Social Connections: Moderately Isolated (11/28/2020)   Social Connection and Isolation Panel [NHANES]    Frequency of Communication with Friends and Family: Twice a week    Frequency of Social Gatherings with Friends and Family: More than three times a week    Attends Religious Services: 1 to 4 times per year    Active Member of [employer] or Organizations: No    Attends Banker Meetings: Never    Marital Status: Never married  Intimate Partner Violence: Not At Risk (11/28/2020)   Humiliation, Afraid, Rape, and Kick questionnaire    Fear of Current or Ex-Partner: No    Emotionally Abused: No    Physically Abused: No    Sexually Abused: No   Social History   Tobacco Use  Smoking Status Never  Smokeless Tobacco Never   Social History   Substance and Sexual Activity  Alcohol Use Yes   Comment: occ    Family History  Problem Relation Age of Onset   Hypertension Mother    Migraines Mother      ROS: Denies fever, fatigue, unexplained weight loss/gain, chest pain, SHOB, and palpitations. Denies neurological deficits, gastrointestinal or genitourinary complaints, and skin changes.   Objective:   There were no vitals filed for this visit.  GENERAL APPEARANCE: Well-appearing, in NAD. Well nourished.  SKIN: Pink, warm and dry. Turgor normal. No rash, lesion, ulceration, or ecchymoses. Hair evenly distributed.  HEENT: HEAD: Normocephalic.  EYES: PERRLA. EOMI. Lids intact w/o defect. Sclera white, Conjunctiva pink w/o exudate.  EARS: External ear w/o redness, swelling, masses or lesions. EAC  clear. TM's intact, translucent w/o bulging, appropriate landmarks visualized. Appropriate acuity to conversational tones.  NOSE: Septum midline w/o deformity. Nares patent, mucosa pink and non-inflamed w/o drainage. No sinus tenderness.  THROAT: Uvula midline. Oropharynx clear. Tonsils non-inflamed w/o exudate. Oral mucosa pink and moist.  NECK: Supple, Trachea midline. Full ROM w/o pain or tenderness. No lymphadenopathy. Thyroid non-tender w/o enlargement or palpable masses.  BREASTS: Breasts pendulous, symmetrical, and w/o palpable masses. Nipples everted and w/o discharge. No rash or skin retraction. No axillary or supraclavicular lymphadenopathy.  RESPIRATORY: Chest wall symmetrical w/o masses. Respirations even and non-labored. Breath sounds clear to auscultation bilaterally. No wheezes, rales, rhonchi, or crackles. CARDIAC: S1, S2 present, regular rate and rhythm. No gallops, murmurs, rubs, or clicks. PMI w/o lifts, heaves, or thrills. No carotid bruits. Capillary refill <2 seconds. Peripheral pulses 2+ bilaterally. GI: Abdomen soft w/o distention. Normoactive bowel sounds. No palpable masses or tenderness. No guarding or rebound tenderness. Liver and spleen w/o tenderness or enlargement. No CVA tenderness.  GU: External genitalia without erythema, lesions, or masses. No lymphadenopathy. Vaginal mucosa pink and moist without exudate, lesions, or ulcerations. Cervix pink without discharge. Cervical os closed. Uterus and adnexae palpable, not enlarged, and w/o tenderness. No palpable masses.  MSK: Muscle tone and strength appropriate for age, w/o atrophy or abnormal movement.  EXTREMITIES: Active ROM intact, w/o tenderness, crepitus, or contracture. No obvious joint deformities or effusions. No clubbing, edema, or cyanosis.  NEUROLOGIC: CN's II-XII intact. Motor strength symmetrical with no  obvious weakness. No sensory deficits. DTR's 2+ symmetric bilaterally. Steady, even gait.  PSYCH/MENTAL  STATUS: Alert, oriented x 3. Cooperative, appropriate mood and affect.   Chaperoned by Cristy Hilts, CMA   Results for orders placed or performed in visit on 05/17/23  TSH  Result Value Ref Range   TSH 2.080 0.450 - 4.500 uIU/mL  Hemoglobin A1c  Result Value Ref Range   Hgb A1c MFr Bld 5.2 4.8 - 5.6 %   Est. average glucose Bld gHb Est-mCnc 103 mg/dL  CBC with Differential/Platelet  Result Value Ref Range   WBC 4.8 3.4 - 10.8 x10E3/uL   RBC 4.03 3.77 - 5.28 x10E6/uL   Hemoglobin 13.1 11.1 - 15.9 g/dL   Hematocrit 08.6 57.8 - 46.6 %   MCV 96 79 - 97 fL   MCH 32.5 26.6 - 33.0 pg   MCHC 33.9 31.5 - 35.7 g/dL   RDW 46.9 62.9 - 52.8 %   Platelets 296 150 - 450 x10E3/uL   Neutrophils 51 Not Estab. %   Lymphs 37 Not Estab. %   Monocytes 9 Not Estab. %   Eos 2 Not Estab. %   Basos 1 Not Estab. %   Neutrophils Absolute 2.5 1.4 - 7.0 x10E3/uL   Lymphocytes Absolute 1.8 0.7 - 3.1 x10E3/uL   Monocytes Absolute 0.4 0.1 - 0.9 x10E3/uL   EOS (ABSOLUTE) 0.1 0.0 - 0.4 x10E3/uL   Basophils Absolute 0.0 0.0 - 0.2 x10E3/uL   Immature Granulocytes 0 Not Estab. %   Immature Grans (Abs) 0.0 0.0 - 0.1 x10E3/uL  Comprehensive metabolic panel  Result Value Ref Range   Glucose 87 70 - 99 mg/dL   BUN 11 6 - 20 mg/dL   Creatinine, Ser 4.13 0.57 - 1.00 mg/dL   eGFR 92 >24 MW/NUU/7.25   BUN/Creatinine Ratio 12 9 - 23   Sodium 140 134 - 144 mmol/L   Potassium 4.3 3.5 - 5.2 mmol/L   Chloride 105 96 - 106 mmol/L   CO2 21 20 - 29 mmol/L   Calcium 9.7 8.7 - 10.2 mg/dL   Total Protein 6.8 6.0 - 8.5 g/dL   Albumin 4.6 4.0 - 5.0 g/dL   Globulin, Total 2.2 1.5 - 4.5 g/dL   Bilirubin Total 1.2 0.0 - 1.2 mg/dL   Alkaline Phosphatase 40 (L) 44 - 121 IU/L   AST 15 0 - 40 IU/L   ALT 21 0 - 32 IU/L  Lipid panel  Result Value Ref Range   Cholesterol, Total 114 100 - 199 mg/dL   Triglycerides 70 0 - 149 mg/dL   HDL 54 >36 mg/dL   VLDL Cholesterol Cal 15 5 - 40 mg/dL   LDL Chol Calc (NIH) 45 0 - 99 mg/dL    Chol/HDL Ratio 2.1 0.0 - 4.4 ratio  VITAMIN D 25 Hydroxy (Vit-D Deficiency, Fractures)  Result Value Ref Range   Vit D, 25-Hydroxy 37.8 30.0 - 100.0 ng/mL  Vitamin B12  Result Value Ref Range   Vitamin B-12 293 232 - 1,245 pg/mL  Iron, TIBC and Ferritin Panel  Result Value Ref Range   Total Iron Binding Capacity 325 250 - 450 ug/dL   UIBC 644 034 - 742 ug/dL   Iron 595 27 - 638 ug/dL   Iron Saturation 38 15 - 55 %   Ferritin 39 15 - 150 ng/mL    Assessment & Plan:  ***  No orders of the defined types were placed in this encounter.   PATIENT COUNSELING:  - Encouraged  a healthy well-balanced diet. Patient may adjust caloric intake to maintain or achieve ideal body weight. May reduce intake of dietary saturated fat and total fat and have adequate dietary potassium and calcium preferably from fresh fruits, vegetables, and low-fat dairy products.   - Advised to avoid cigarette smoking. - Discussed with the patient that most people either abstain from alcohol or drink within safe limits (<=14/week and <=4 drinks/occasion for males, <=7/weeks and <= 3 drinks/occasion for females) and that the risk for alcohol disorders and other health effects rises proportionally with the number of drinks per week and how often a drinker exceeds daily limits. - Discussed cessation/primary prevention of drug use and availability of treatment for abuse.  - Discussed sexually transmitted diseases, avoidance of unintended pregnancy and contraceptive alternatives.  - Stressed the importance of regular exercise - Injury prevention: Discussed safety belts, safety helmets, smoke detector, smoking near bedding or upholstery.  - Dental health: Discussed importance of regular tooth brushing, flossing, and dental visits.   NEXT PREVENTATIVE PHYSICAL DUE IN 1 YEAR.  No follow-ups on file.  Patient to reach out to office if new, worrisome, or unresolved symptoms arise or if no improvement in patient's condition.  Patient verbalized understanding and is agreeable to treatment plan. All questions answered to patient's satisfaction.   Of note, portions of this note may have been created with voice recognition software Physicist, medical). While this note has been edited for accuracy, occasional wrong-word or 'sound-a-like' substitutions may have occurred due to the inherent limitations of voice recognition software.  Yolanda Manges, FNP

## 2023-06-01 ENCOUNTER — Encounter (HOSPITAL_BASED_OUTPATIENT_CLINIC_OR_DEPARTMENT_OTHER): Payer: 59 | Admitting: Family Medicine

## 2023-06-01 DIAGNOSIS — S62623A Displaced fracture of medial phalanx of left middle finger, initial encounter for closed fracture: Secondary | ICD-10-CM | POA: Diagnosis not present

## 2023-06-02 NOTE — Progress Notes (Signed)
 Patient's labs are within normal limits. Follow up as discussed per visit.

## 2023-06-03 ENCOUNTER — Ambulatory Visit (HOSPITAL_BASED_OUTPATIENT_CLINIC_OR_DEPARTMENT_OTHER): Payer: 59 | Admitting: Family Medicine

## 2023-06-03 DIAGNOSIS — G8918 Other acute postprocedural pain: Secondary | ICD-10-CM | POA: Insufficient documentation

## 2023-06-03 DIAGNOSIS — S62623A Displaced fracture of medial phalanx of left middle finger, initial encounter for closed fracture: Secondary | ICD-10-CM | POA: Diagnosis not present

## 2023-06-03 HISTORY — PX: FINGER FRACTURE SURGERY: SHX638

## 2023-06-07 ENCOUNTER — Encounter (HOSPITAL_BASED_OUTPATIENT_CLINIC_OR_DEPARTMENT_OTHER): Payer: 59 | Admitting: Family Medicine

## 2023-06-10 DIAGNOSIS — M25642 Stiffness of left hand, not elsewhere classified: Secondary | ICD-10-CM | POA: Diagnosis not present

## 2023-06-13 DIAGNOSIS — S62623A Displaced fracture of medial phalanx of left middle finger, initial encounter for closed fracture: Secondary | ICD-10-CM | POA: Diagnosis not present

## 2023-06-15 ENCOUNTER — Other Ambulatory Visit (HOSPITAL_BASED_OUTPATIENT_CLINIC_OR_DEPARTMENT_OTHER): Payer: Self-pay | Admitting: Family Medicine

## 2023-06-16 ENCOUNTER — Other Ambulatory Visit (HOSPITAL_BASED_OUTPATIENT_CLINIC_OR_DEPARTMENT_OTHER): Payer: Self-pay | Admitting: Family Medicine

## 2023-06-17 DIAGNOSIS — M25642 Stiffness of left hand, not elsewhere classified: Secondary | ICD-10-CM | POA: Diagnosis not present

## 2023-06-22 ENCOUNTER — Ambulatory Visit (HOSPITAL_BASED_OUTPATIENT_CLINIC_OR_DEPARTMENT_OTHER): Payer: 59 | Admitting: Family Medicine

## 2023-06-22 ENCOUNTER — Encounter (HOSPITAL_BASED_OUTPATIENT_CLINIC_OR_DEPARTMENT_OTHER): Payer: Self-pay | Admitting: Family Medicine

## 2023-06-22 VITALS — BP 136/88 | Ht 62.0 in | Wt 158.7 lb

## 2023-06-22 DIAGNOSIS — F900 Attention-deficit hyperactivity disorder, predominantly inattentive type: Secondary | ICD-10-CM

## 2023-06-22 DIAGNOSIS — F5101 Primary insomnia: Secondary | ICD-10-CM

## 2023-06-22 MED ORDER — METHYLPHENIDATE HCL ER 20 MG PO TBCR
EXTENDED_RELEASE_TABLET | ORAL | 0 refills | Status: DC
Start: 2023-06-22 — End: 2023-08-15

## 2023-06-22 MED ORDER — TRAZODONE HCL 50 MG PO TABS
25.0000 mg | ORAL_TABLET | Freq: Every evening | ORAL | 2 refills | Status: DC | PRN
Start: 2023-06-22 — End: 2023-08-15

## 2023-06-25 DIAGNOSIS — F5101 Primary insomnia: Secondary | ICD-10-CM | POA: Insufficient documentation

## 2023-06-25 NOTE — Assessment & Plan Note (Signed)
Patient is a 22 year old female patient who presents today for ADHD management. Patient reports she was prescribed Ritalin but reports that she did not want to change her ADHD medication regimen. Patient reports her symptoms were well controlled on Concerta (unsure dose). However, she reports that she does notice that inattentive symptoms in the early afternoon. Discussed use of short-acting ADHD medication but patient would like to stay on Concerta. Prescribed patient Concerta 20mg  to take daily in the morning. PDMP reviewed, no red flags present. Discussed patient could take 10mg  (0.5 tablet) in the early afternoon for continued management of symptoms. Plan to follow-up in 4 weeks to determine if medication regimen is controlling symptoms adequately.

## 2023-06-25 NOTE — Assessment & Plan Note (Signed)
Patient she reports that she deals with insomnia, mostly difficulty falling and staying asleep, some nights out of the week. Reports sleeping well with trazodone in the past. Discussed concerns with afternoon dose of Concerta. Patient reports that she will advise if this current medication regimen is interfering with her sleep. Prescribed patient trazodone 25-50mg  at bedtime PRN for nights she is unable to sleep well.

## 2023-07-07 DIAGNOSIS — S62623A Displaced fracture of medial phalanx of left middle finger, initial encounter for closed fracture: Secondary | ICD-10-CM | POA: Diagnosis not present

## 2023-07-08 ENCOUNTER — Ambulatory Visit: Payer: 59

## 2023-07-08 ENCOUNTER — Encounter: Payer: Self-pay | Admitting: Adult Health

## 2023-07-08 ENCOUNTER — Ambulatory Visit: Payer: 59 | Admitting: Adult Health

## 2023-07-08 VITALS — BP 134/87 | HR 91 | Ht 62.0 in | Wt 159.5 lb

## 2023-07-08 DIAGNOSIS — Z3042 Encounter for surveillance of injectable contraceptive: Secondary | ICD-10-CM | POA: Insufficient documentation

## 2023-07-08 DIAGNOSIS — Z3009 Encounter for other general counseling and advice on contraception: Secondary | ICD-10-CM | POA: Diagnosis not present

## 2023-07-08 MED ORDER — MEDROXYPROGESTERONE ACETATE 150 MG/ML IM SUSY
150.0000 mg | PREFILLED_SYRINGE | Freq: Once | INTRAMUSCULAR | Status: AC
Start: 2023-07-08 — End: 2023-07-08
  Administered 2023-07-08: 150 mg via INTRAMUSCULAR

## 2023-07-08 NOTE — Addendum Note (Signed)
Addended by: Colen Darling on: 07/08/2023 09:53 AM   Modules accepted: Orders

## 2023-07-08 NOTE — Progress Notes (Signed)
  Subjective:     Patient ID: Tonya Mclaughlin, female   DOB: March 12, 2001, 22 y.o.   MRN: [id number]  HPI Tonya Mclaughlin is a 22 year old white female,single, G0P0, in to discuss birth control options, is currently on Depo and shot due today.     Component Value Date/Time   DIAGPAP  11/28/2020 1256    - Negative for intraepithelial lesion or malignancy (NILM)   ADEQPAP  11/28/2020 1256    Satisfactory for evaluation; transformation zone component PRESENT.    PCP is Cari Caraway FNP  Review of Systems Has period about time depo due Denies migraine with aura,MI,stroke, DVT or breast cancer Reviewed past medical,surgical, social and family history. Reviewed medications and allergies.     Objective:   Physical Exam BP 134/87 (BP Location: Left Arm, Patient Position: Sitting, Cuff Size: Normal)   Pulse 91   Ht 5\' 2"  (1.575 m)   Wt 159 lb 8 oz (72.3 kg)   LMP 07/01/2023   BMI 29.17 kg/m     Skin warm and dry. Lungs: clear to ausculation bilaterally. Cardiovascular: regular rate and rhythm.   Upstream - 07/08/23 0855       Pregnancy Intention Screening   Does the patient want to become pregnant in the next year? No    Does the patient's partner want to become pregnant in the next year? No    Would the patient like to discuss contraceptive options today? Yes      Contraception Wrap Up   Current Method Hormonal Injection    End Method Hormonal Injection    Contraception Counseling Provided Yes    How was the end contraceptive method provided? Provided on site             Assessment:     1. Depo-Provera contraceptive status Will give depo in office today   2. Encounter for surveillance of injectable contraceptive Has refills on depo  3. General counseling and advice on contraceptive management Discussed all options, and she will continue depo for now but considering ParaGard IUD Review handout on ParaGard    Plan:     Follow up in 12 weeks for depo and pap

## 2023-07-12 DIAGNOSIS — M25642 Stiffness of left hand, not elsewhere classified: Secondary | ICD-10-CM | POA: Diagnosis not present

## 2023-07-14 ENCOUNTER — Telehealth (HOSPITAL_BASED_OUTPATIENT_CLINIC_OR_DEPARTMENT_OTHER): Payer: 59 | Admitting: Family Medicine

## 2023-07-14 ENCOUNTER — Encounter (HOSPITAL_BASED_OUTPATIENT_CLINIC_OR_DEPARTMENT_OTHER): Payer: Self-pay | Admitting: Family Medicine

## 2023-07-14 VITALS — Ht 62.0 in | Wt 159.2 lb

## 2023-07-14 DIAGNOSIS — F330 Major depressive disorder, recurrent, mild: Secondary | ICD-10-CM | POA: Diagnosis not present

## 2023-07-14 DIAGNOSIS — F419 Anxiety disorder, unspecified: Secondary | ICD-10-CM | POA: Diagnosis not present

## 2023-07-14 NOTE — Progress Notes (Signed)
Virtual Visit via Video Note  I connected with Aneesah Schwall on 07/14/23 at 2:55PM by a video enabled telemedicine application and verified that I am speaking with the correct person using two identifiers.  Patient Location: Home Provider Location: Office/Clinic  I discussed the limitations, risks, security, and privacy concerns of performing an evaluation and management service by video and the availability of in person appointments. I also discussed with the patient that there may be a patient responsible charge related to this service. The patient expressed understanding and agreed to proceed.  Subjective: PCP: Alyson Reedy, FNP  Chief Complaint  Patient presents with   ESA    Inquiring about a letter for emotional support animal   Tonya Mclaughlin is a 22 year old female patient who presents today for discussion about her anxiety and depression.  She reports she was seeing a therapist in the past, but could not continue due to issues with insurance coverage.  While she was going to therapy, they discussed an emotional support animal.  She noticed her anxiety and depression increased in 2019 with the pandemic, starting in school, and being away from her family for the first time. She reports that she did care for her sister's dog in that time frame and did end up moving back home and therefore was able to see her dog and have that support.  She has been on medication in the past- low dose buspar (did Gene[employer] testing) but does not like daily medication for something she does not struggle with significantly on a daily basis.       07/14/2023    3:11 PM 05/06/2023   10:03 AM 11/28/2020   12:48 PM  PHQ9 SCORE ONLY  PHQ-9 Total Score 5 7 11       07/14/2023    3:09 PM 05/06/2023   10:04 AM 11/28/2020   12:49 PM  GAD 7 : Generalized Anxiety Score  Nervous, Anxious, on Edge 2 2 2   Control/stop worrying 3 2 0  Worry too much - different things 3 1 1   Trouble relaxing 0 0 1  Restless 1 0  0  Easily annoyed or irritable 2 2 1   Afraid - awful might happen 0 1 0  Total GAD 7 Score 11 8 5   Anxiety Difficulty Not difficult at all Somewhat difficult    ROS: Per HPI  Current Outpatient Medications:    albuterol (PROVENTIL) (2.5 MG/3ML) 0.083% nebulizer solution, Take 3 mLs (2.5 mg total) by nebulization every 6 (six) hours as needed for wheezing or shortness of breath., Disp: 150 mL, Rfl: 1   Albuterol Sulfate (PROAIR RESPICLICK) 108 (90 Base) MCG/ACT AEPB, Inhale 1-2 puffs into the lungs every 6 (six) hours as needed., Disp: 1 each, Rfl: 0   clindamycin (CLEOCIN T) 1 % SWAB, Apply 1 Application topically 2 (two) times daily., Disp: 60 each, Rfl: 3   medroxyPROGESTERone (DEPO-PROVERA) 150 MG/ML injection, Inject 1 mL (150 mg total) into the muscle every 3 (three) months., Disp: 1 mL, Rfl: 4   methocarbamol (ROBAXIN) 750 MG tablet, Take 750 mg by mouth every 8 (eight) hours as needed. For back spasm ., Disp: , Rfl:    methylphenidate 20 MG PO ER tablet, Take 20mg  (1 tablet) daily prior to breakfast. In the afternoon, no later 12pm, take 1/2 tablet (10mg ) PRN for adequate symptom control., Disp: 45 tablet, Rfl: 0   silver sulfADIAZINE (SILVADENE) 1 % cream, Apply 1 application. topically daily., Disp: 50 g, Rfl: 0   Tazarotene (ARAZLO)  0.045 % LOTN, Apply 1 Application topically at bedtime., Disp: 45 g, Rfl: 3   traZODone (DESYREL) 50 MG tablet, Take 0.5-1 tablets (25-50 mg total) by mouth at bedtime as needed for sleep., Disp: 60 tablet, Rfl: 2  Observations/Objective: Today's Vitals   07/14/23 1456  Weight: 159 lb 3.2 oz (72.2 kg)  Height: 5\' 2"  (1.575 m)    General: Alert and oriented x 4. Speaking in clear and full sentences, no audible heavy breathing, no acute distress.  Sounds alert and appropriately interactive.  Appears well.  Face symmetric.  Extraocular movements intact.  Pupils equal and round.  No nasal flaring or accessory muscle use visualized.  Assessment and  Plan: 1. MDD (major depressive disorder), recurrent episode, mild (HCC) Patient is a pleasant 22 year old female patient who presents today for discussion regarding her mood. She reports a history of anxiety and depression. She reports there have been various times in her life when she used her dog as an emotional support but has not needed documentation to state this. Patient reports she is currently moving out of her parent's home and would like to have her dog allowed in her new residence. She reports her dog helps benefit her mood and helps her from feeling depressed and overwhelmed. GAD7 completed today with score of 11. PHQ9 completed today with score of 5. Denies SI/HI. Discussed finding new therapist covered by insurance and daily pharmacotherapy for anxiety and depression. Will provide patient with letter for emotional support animal.   2. Anxiety See #1   Follow Up Instructions: Return in about 3 months (around 10/14/2023) for Mood f/u.   I discussed the assessment and treatment plan with the patient. The patient was provided an opportunity to ask questions, and all were answered. The patient agreed with the plan and demonstrated an understanding of the instructions.   The patient was advised to call back or seek an in-person evaluation if the symptoms worsen or if the condition fails to improve as anticipated.  The above assessment and management plan was discussed with the patient. The patient verbalized understanding of and has agreed to the management plan.   Alyson Reedy, FNP

## 2023-07-15 DIAGNOSIS — M25642 Stiffness of left hand, not elsewhere classified: Secondary | ICD-10-CM | POA: Diagnosis not present

## 2023-07-21 ENCOUNTER — Ambulatory Visit (HOSPITAL_BASED_OUTPATIENT_CLINIC_OR_DEPARTMENT_OTHER): Payer: 59 | Admitting: Family Medicine

## 2023-07-21 DIAGNOSIS — S62623A Displaced fracture of medial phalanx of left middle finger, initial encounter for closed fracture: Secondary | ICD-10-CM | POA: Diagnosis not present

## 2023-07-29 DIAGNOSIS — Y999 Unspecified external cause status: Secondary | ICD-10-CM | POA: Diagnosis not present

## 2023-07-29 DIAGNOSIS — X58XXXA Exposure to other specified factors, initial encounter: Secondary | ICD-10-CM | POA: Diagnosis not present

## 2023-07-29 DIAGNOSIS — S62623A Displaced fracture of medial phalanx of left middle finger, initial encounter for closed fracture: Secondary | ICD-10-CM | POA: Diagnosis not present

## 2023-07-29 DIAGNOSIS — S62623P Displaced fracture of medial phalanx of left middle finger, subsequent encounter for fracture with malunion: Secondary | ICD-10-CM | POA: Diagnosis not present

## 2023-08-02 ENCOUNTER — Ambulatory Visit: Payer: 59 | Admitting: Dermatology

## 2023-08-02 ENCOUNTER — Encounter: Payer: Self-pay | Admitting: Dermatology

## 2023-08-02 VITALS — BP 160/106 | HR 84

## 2023-08-02 DIAGNOSIS — R110 Nausea: Secondary | ICD-10-CM | POA: Insufficient documentation

## 2023-08-02 DIAGNOSIS — R1033 Periumbilical pain: Secondary | ICD-10-CM | POA: Insufficient documentation

## 2023-08-02 DIAGNOSIS — Z1283 Encounter for screening for malignant neoplasm of skin: Secondary | ICD-10-CM | POA: Diagnosis not present

## 2023-08-02 DIAGNOSIS — K219 Gastro-esophageal reflux disease without esophagitis: Secondary | ICD-10-CM | POA: Insufficient documentation

## 2023-08-02 DIAGNOSIS — D1801 Hemangioma of skin and subcutaneous tissue: Secondary | ICD-10-CM

## 2023-08-02 DIAGNOSIS — R140 Abdominal distension (gaseous): Secondary | ICD-10-CM | POA: Insufficient documentation

## 2023-08-02 DIAGNOSIS — D229 Melanocytic nevi, unspecified: Secondary | ICD-10-CM

## 2023-08-02 DIAGNOSIS — D239 Other benign neoplasm of skin, unspecified: Secondary | ICD-10-CM

## 2023-08-02 DIAGNOSIS — K5900 Constipation, unspecified: Secondary | ICD-10-CM | POA: Insufficient documentation

## 2023-08-02 DIAGNOSIS — L814 Other melanin hyperpigmentation: Secondary | ICD-10-CM

## 2023-08-02 NOTE — Patient Instructions (Signed)

## 2023-08-02 NOTE — Progress Notes (Signed)
   New Patient Visit   Subjective  Tonya Mclaughlin is a 22 y.o. female who presents for the following:  Total Body Skin Exam (TBSE)  Patient present today for new patient visit for TBSE.The patient reports she has spots, moles and lesions to be evaluated, some may be new or changing and the patient may have concern these could be cancer. Patient has previously been treated by a dermatologist. Patient reports she has hx of bx. Patient denies family history of skin cancers. Patient reports throughout her lifetime has had moderate sun exposure. Currently, patient reports if she has excessive sun exposure, she does apply sunscreen and/or wears protective coverings.  The following portions of the chart were reviewed this encounter and updated as appropriate: medications, allergies, medical history  Review of Systems:  No other skin or systemic complaints except as noted in HPI or Assessment and Plan.  Objective  Well appearing patient in no apparent distress; mood and affect are within normal limits.  A full examination was performed including scalp, head, eyes, ears, nose, lips, neck, chest, axillae, abdomen, back, buttocks, bilateral upper extremities, bilateral lower extremities, hands, feet, fingers, toes, fingernails, and toenails. All findings within normal limits unless otherwise noted below.     Relevant exam findings are noted in the Assessment and Plan.    Assessment & Plan   BENIGN MELANOCYTIC NEVI  - Tan-brown and/or pink-flesh-colored symmetric macules and papules - Benign appearing on exam today - Observation - Call clinic for new or changing moles - Recommend daily use of broad spectrum spf 30+ sunscreen to sun-exposed areas.   MILD ACTINIC DAMAGE - Chronic condition, secondary to cumulative UV/sun exposure - diffuse scaly erythematous macules with underlying dyspigmentation - Recommend daily broad spectrum sunscreen SPF 30+ to sun-exposed areas, reapply every 2 hours as  needed.  - Staying in the shade or wearing long sleeves, sun glasses (UVA+UVB protection) and wide brim hats (4-inch brim around the entire circumference of the hat) are also recommended for sun protection.  - Call for new or changing lesions.   Dysplastic Nevus in Gluteal Crease (Bx proven - Assessment: Re pigmentation at site of recent bx scar - Plan: Discuss with the patient the options of monitoring for pigment recurrence or proceeding with a shave excision. Schedule a follow-up for a shave biopsy should the patient opt for excision.     SKIN CANCER SCREENING PERFORMED TODAY Skin exam for malignant neoplasm  Multiple benign melanocytic nevi  Lentigines  Cherry angioma  Dysplastic nevus     Return in about 1 year (around 08/01/2024) for TBSE.   Documentation: I have reviewed the above documentation for accuracy and completeness, and I agree with the above.   I, Shirron Marcha Solders, CMA, am acting as scribe for [employer], DO.   Langston Reusing, DO

## 2023-08-04 DIAGNOSIS — S62623D Displaced fracture of medial phalanx of left middle finger, subsequent encounter for fracture with routine healing: Secondary | ICD-10-CM | POA: Diagnosis not present

## 2023-08-10 ENCOUNTER — Other Ambulatory Visit (HOSPITAL_COMMUNITY): Payer: Self-pay

## 2023-08-15 ENCOUNTER — Other Ambulatory Visit (HOSPITAL_BASED_OUTPATIENT_CLINIC_OR_DEPARTMENT_OTHER): Payer: Self-pay | Admitting: Family Medicine

## 2023-08-15 ENCOUNTER — Encounter (HOSPITAL_BASED_OUTPATIENT_CLINIC_OR_DEPARTMENT_OTHER): Payer: Self-pay | Admitting: Family Medicine

## 2023-08-15 DIAGNOSIS — F5101 Primary insomnia: Secondary | ICD-10-CM

## 2023-08-15 DIAGNOSIS — F900 Attention-deficit hyperactivity disorder, predominantly inattentive type: Secondary | ICD-10-CM

## 2023-08-15 MED ORDER — MEDROXYPROGESTERONE ACETATE 150 MG/ML IM SUSP: 150.0000 mg | INTRAMUSCULAR | 4 refills | Status: DC

## 2023-08-15 MED ORDER — METHYLPHENIDATE HCL ER 20 MG PO TBCR: EXTENDED_RELEASE_TABLET | ORAL | 0 refills | Status: DC

## 2023-08-15 MED ORDER — TRAZODONE HCL 50 MG PO TABS: 25.0000 mg | ORAL_TABLET | Freq: Every evening | ORAL | 2 refills | Status: DC | PRN

## 2023-08-15 NOTE — Progress Notes (Signed)
PDMP reviewed, no red flags present. Refill sent for Concerta.

## 2023-08-16 ENCOUNTER — Encounter (HOSPITAL_BASED_OUTPATIENT_CLINIC_OR_DEPARTMENT_OTHER): Payer: Self-pay

## 2023-08-16 ENCOUNTER — Other Ambulatory Visit (HOSPITAL_BASED_OUTPATIENT_CLINIC_OR_DEPARTMENT_OTHER): Payer: Self-pay | Admitting: Family Medicine

## 2023-08-16 DIAGNOSIS — S62623D Displaced fracture of medial phalanx of left middle finger, subsequent encounter for fracture with routine healing: Secondary | ICD-10-CM | POA: Diagnosis not present

## 2023-08-16 DIAGNOSIS — F5101 Primary insomnia: Secondary | ICD-10-CM

## 2023-08-16 DIAGNOSIS — F900 Attention-deficit hyperactivity disorder, predominantly inattentive type: Secondary | ICD-10-CM

## 2023-08-16 MED ORDER — METHYLPHENIDATE HCL ER 20 MG PO TBCR: EXTENDED_RELEASE_TABLET | ORAL | 0 refills | Status: DC

## 2023-08-16 MED ORDER — TRAZODONE HCL 50 MG PO TABS: 25.0000 mg | ORAL_TABLET | Freq: Every evening | ORAL | 0 refills | Status: DC | PRN

## 2023-08-18 ENCOUNTER — Telehealth (HOSPITAL_BASED_OUTPATIENT_CLINIC_OR_DEPARTMENT_OTHER): Payer: Self-pay | Admitting: Family Medicine

## 2023-08-18 ENCOUNTER — Encounter (HOSPITAL_BASED_OUTPATIENT_CLINIC_OR_DEPARTMENT_OTHER): Payer: Self-pay | Admitting: Family Medicine

## 2023-08-18 ENCOUNTER — Other Ambulatory Visit (HOSPITAL_BASED_OUTPATIENT_CLINIC_OR_DEPARTMENT_OTHER): Payer: Self-pay | Admitting: Family Medicine

## 2023-08-18 ENCOUNTER — Telehealth (HOSPITAL_BASED_OUTPATIENT_CLINIC_OR_DEPARTMENT_OTHER): Payer: 59 | Admitting: Family Medicine

## 2023-08-18 VITALS — Ht 62.0 in | Wt 166.0 lb

## 2023-08-18 DIAGNOSIS — F5101 Primary insomnia: Secondary | ICD-10-CM

## 2023-08-18 DIAGNOSIS — F900 Attention-deficit hyperactivity disorder, predominantly inattentive type: Secondary | ICD-10-CM

## 2023-08-18 MED ORDER — LISDEXAMFETAMINE DIMESYLATE 30 MG PO CAPS: 30.0000 mg | ORAL_CAPSULE | Freq: Every day | ORAL | 0 refills | Status: AC

## 2023-08-18 MED ORDER — METHYLPHENIDATE HCL ER 10 MG PO TBCR: 10.0000 mg | EXTENDED_RELEASE_TABLET | Freq: Every day | ORAL | 0 refills | Status: DC | PRN

## 2023-08-18 MED ORDER — LISDEXAMFETAMINE DIMESYLATE 20 MG PO CAPS: 20.0000 mg | ORAL_CAPSULE | Freq: Every day | ORAL | 0 refills | Status: AC

## 2023-08-18 MED ORDER — TRAZODONE HCL 50 MG PO TABS: 25.0000 mg | ORAL_TABLET | Freq: Every evening | ORAL | 3 refills | Status: AC | PRN

## 2023-08-18 MED ORDER — LISDEXAMFETAMINE DIMESYLATE 10 MG PO CAPS: 10.0000 mg | ORAL_CAPSULE | Freq: Every day | ORAL | 0 refills | Status: AC

## 2023-08-18 NOTE — Progress Notes (Signed)
Virtual Visit via Video Note  I connected with Tonya Mclaughlin on 08/18/23 at 4:23 PM by a video enabled telemedicine application and verified that I am speaking with the correct person using two identifiers.  Patient Location: Home Provider Location: Office/Clinic  I discussed the limitations, risks, security, and privacy concerns of performing an evaluation and management service by video and the availability of in person appointments. I also discussed with the patient that there may be a patient responsible charge related to this service. The patient expressed understanding and agreed to proceed.  Tonya Mclaughlin is a 22 year old female patient who presents today virtually for ADHD follow-up and insomnia.  She scheduled a visit to avoid communicating through MyChart.   Patient reports she has been taking Concerta 20mg  daily in the morning and has been taking 10mg  in the afternoon. She reports that she does not take 10mg  every afternoon and does not take the medication on the weekends.   On MyChart, was communicating with patient regarding prescribing immediate release methylphenidate for the afternoon, however, she reports the immediate release methylphenidate (Ritalin) made her more irritable and she does not want to take a medication from this class again. She reports that she tolerated Vyvanse in the past and felt like this lasted longer throughout the day.   Subjective: PCP: Alyson Reedy, FNP  Chief Complaint  Patient presents with   Medication Management    Discuss medications, ritalin reaction was severe irritability. Possibly get back on vyvanse to help with symptoms and insomnia per patient   ROS: Per HPI  Current Outpatient Medications:    albuterol (PROVENTIL) (2.5 MG/3ML) 0.083% nebulizer solution, Take 3 mLs (2.5 mg total) by nebulization every 6 (six) hours as needed for wheezing or shortness of breath., Disp: 150 mL, Rfl: 1   Albuterol Sulfate (PROAIR RESPICLICK) 108 (90  Base) MCG/ACT AEPB, Inhale 1-2 puffs into the lungs every 6 (six) hours as needed., Disp: 1 each, Rfl: 0   clindamycin (CLEOCIN T) 1 % SWAB, Apply 1 Application topically 2 (two) times daily., Disp: 60 each, Rfl: 3   [START ON 08/19/2023] lisdexamfetamine (VYVANSE) 10 MG capsule, Take 1 capsule (10 mg total) by mouth daily for 6 days., Disp: 5 capsule, Rfl: 0   [START ON 08/25/2023] lisdexamfetamine (VYVANSE) 20 MG capsule, Take 1 capsule (20 mg total) by mouth daily for 6 days., Disp: 5 capsule, Rfl: 0   [START ON 08/31/2023] lisdexamfetamine (VYVANSE) 30 MG capsule, Take 1 capsule (30 mg total) by mouth daily., Disp: 30 capsule, Rfl: 0   medroxyPROGESTERone (DEPO-PROVERA) 150 MG/ML injection, Inject 1 mL (150 mg total) into the muscle every 3 (three) months., Disp: 1 mL, Rfl: 4   methocarbamol (ROBAXIN) 750 MG tablet, Take 750 mg by mouth every 8 (eight) hours as needed. For back spasm ., Disp: , Rfl:    silver sulfADIAZINE (SILVADENE) 1 % cream, Apply 1 application. topically daily., Disp: 50 g, Rfl: 0   Tazarotene (ARAZLO) 0.045 % LOTN, Apply 1 Application topically at bedtime., Disp: 45 g, Rfl: 3   traZODone (DESYREL) 50 MG tablet, Take 0.5-1 tablets (25-50 mg total) by mouth at bedtime as needed for sleep., Disp: 30 tablet, Rfl: 3  Observations/Objective: Today's Vitals   08/18/23 1408  Weight: 166 lb (75.3 kg)  Height: 5\' 2"  (1.575 m)    General: Alert and oriented x 4. Speaking in clear and full sentences, no audible heavy breathing, no acute distress.  Sounds alert and appropriately interactive.  Appears well.  Face symmetric.  Extraocular movements intact.  Pupils equal and round.  No nasal flaring or accessory muscle use visualized.  Assessment and Plan: 1. ADHD (attention deficit hyperactivity disorder), inattentive type Discussed continuing on methylphenidate ER 20mg  with a non-stimulant medication for the afternoon, to prevent an exacerbation of her insomnia. Patient prefers to  restart Vyvanse. Discussed slow increasing to previous dose (30 mg) in the past to prevent adverse side effects. Patient verbalized understanding and agreeable to this. Will take Vyvanse 10mg  for 5 days and then increase to 20mg  for another 5 days. Will finally increase to Vyvanse 30mg . Plan to follow-up in Dec/Jan.    2. Primary insomnia Patient reports she does not take the trazodone every night but reports needing it some days throughout the week. Last time medication was sent did not provide with refills. Refills sent today.   - traZODone (DESYREL) 50 MG tablet; Take 0.5-1 tablets (25-50 mg total) by mouth at bedtime as needed for sleep.  Dispense: 30 tablet; Refill: 3   Follow Up Instructions: Return in about 6 weeks (around 09/29/2023), or ADHD follow-up.   I discussed the assessment and treatment plan with the patient. The patient was provided an opportunity to ask questions, and all were answered. The patient agreed with the plan and demonstrated an understanding of the instructions.   The patient was advised to call back or seek an in-person evaluation if the symptoms worsen or if the condition fails to improve as anticipated.  The above assessment and management plan was discussed with the patient. The patient verbalized understanding of and has agreed to the management plan.   Alyson Reedy, FNP

## 2023-08-18 NOTE — Telephone Encounter (Signed)
Pharmacy called regarding quantity for vyvanse 10mg  and 20mg  they stated it says to take once daily for 6 days but pt was only prescribed 5 pills pharmacy wants clarification on quantity

## 2023-08-18 NOTE — Progress Notes (Signed)
PDMP reviewed, no red flags present. Sent 10mg  methylphenidate for PRN lunchtime use.

## 2023-08-22 NOTE — Telephone Encounter (Signed)
Spoke with pharmacy, scripts have been corrected. 10mg  has already been dispensed, will fix the 20mg  script per pharmacy

## 2023-08-29 DIAGNOSIS — S62623D Displaced fracture of medial phalanx of left middle finger, subsequent encounter for fracture with routine healing: Secondary | ICD-10-CM | POA: Diagnosis not present

## 2023-09-09 ENCOUNTER — Telehealth (HOSPITAL_BASED_OUTPATIENT_CLINIC_OR_DEPARTMENT_OTHER): Payer: 59 | Admitting: Family Medicine

## 2023-09-12 DIAGNOSIS — S62623D Displaced fracture of medial phalanx of left middle finger, subsequent encounter for fracture with routine healing: Secondary | ICD-10-CM | POA: Diagnosis not present

## 2023-09-23 ENCOUNTER — Telehealth: Payer: Self-pay | Admitting: *Deleted

## 2023-09-23 ENCOUNTER — Other Ambulatory Visit: Payer: Self-pay | Admitting: Obstetrics & Gynecology

## 2023-09-23 MED ORDER — MEDROXYPROGESTERONE ACETATE 150 MG/ML IM SUSP: 150.0000 mg | INTRAMUSCULAR | 4 refills | Status: AC

## 2023-09-23 NOTE — Telephone Encounter (Signed)
Dr. Despina Hidden sent in refill. JSY

## 2023-09-29 DIAGNOSIS — S62623D Displaced fracture of medial phalanx of left middle finger, subsequent encounter for fracture with routine healing: Secondary | ICD-10-CM | POA: Diagnosis not present

## 2023-09-30 ENCOUNTER — Ambulatory Visit: Payer: 59

## 2023-10-12 DIAGNOSIS — M79645 Pain in left finger(s): Secondary | ICD-10-CM | POA: Diagnosis not present

## 2023-11-04 DIAGNOSIS — M79645 Pain in left finger(s): Secondary | ICD-10-CM | POA: Diagnosis not present

## 2023-11-10 DIAGNOSIS — M25551 Pain in right hip: Secondary | ICD-10-CM | POA: Insufficient documentation

## 2023-11-10 DIAGNOSIS — M25552 Pain in left hip: Secondary | ICD-10-CM | POA: Diagnosis not present

## 2023-11-18 DIAGNOSIS — M79645 Pain in left finger(s): Secondary | ICD-10-CM | POA: Diagnosis not present

## 2023-11-28 ENCOUNTER — Telehealth: Payer: Self-pay | Admitting: Dermatology

## 2023-11-28 ENCOUNTER — Encounter: Payer: Self-pay | Admitting: Dermatology

## 2023-11-28 NOTE — Telephone Encounter (Signed)
 Yes, She can apply a topical steroid for 1-2 weeks :)

## 2023-11-28 NOTE — Telephone Encounter (Signed)
Spoke with patient and advised. Patient voiced understanding.

## 2023-12-02 DIAGNOSIS — M79645 Pain in left finger(s): Secondary | ICD-10-CM | POA: Diagnosis not present

## 2023-12-16 DIAGNOSIS — M79645 Pain in left finger(s): Secondary | ICD-10-CM | POA: Diagnosis not present

## 2023-12-23 DIAGNOSIS — G43019 Migraine without aura, intractable, without status migrainosus: Secondary | ICD-10-CM | POA: Diagnosis not present

## 2023-12-23 DIAGNOSIS — F322 Major depressive disorder, single episode, severe without psychotic features: Secondary | ICD-10-CM | POA: Diagnosis not present

## 2023-12-23 DIAGNOSIS — Z30011 Encounter for initial prescription of contraceptive pills: Secondary | ICD-10-CM | POA: Diagnosis not present

## 2023-12-23 DIAGNOSIS — R5383 Other fatigue: Secondary | ICD-10-CM | POA: Diagnosis not present

## 2023-12-23 DIAGNOSIS — G4452 New daily persistent headache (NDPH): Secondary | ICD-10-CM | POA: Diagnosis not present

## 2023-12-23 DIAGNOSIS — Z13220 Encounter for screening for lipoid disorders: Secondary | ICD-10-CM | POA: Diagnosis not present

## 2023-12-23 DIAGNOSIS — E559 Vitamin D deficiency, unspecified: Secondary | ICD-10-CM | POA: Diagnosis not present

## 2023-12-23 DIAGNOSIS — Z1331 Encounter for screening for depression: Secondary | ICD-10-CM | POA: Diagnosis not present

## 2023-12-23 DIAGNOSIS — J4520 Mild intermittent asthma, uncomplicated: Secondary | ICD-10-CM | POA: Diagnosis not present

## 2023-12-23 DIAGNOSIS — R21 Rash and other nonspecific skin eruption: Secondary | ICD-10-CM | POA: Diagnosis not present

## 2023-12-23 DIAGNOSIS — Z113 Encounter for screening for infections with a predominantly sexual mode of transmission: Secondary | ICD-10-CM | POA: Diagnosis not present

## 2024-01-06 DIAGNOSIS — G43019 Migraine without aura, intractable, without status migrainosus: Secondary | ICD-10-CM | POA: Diagnosis not present

## 2024-01-06 DIAGNOSIS — J323 Chronic sphenoidal sinusitis: Secondary | ICD-10-CM | POA: Diagnosis not present

## 2024-01-06 DIAGNOSIS — G4452 New daily persistent headache (NDPH): Secondary | ICD-10-CM | POA: Diagnosis not present

## 2024-01-13 DIAGNOSIS — M79645 Pain in left finger(s): Secondary | ICD-10-CM | POA: Diagnosis not present

## 2024-01-20 ENCOUNTER — Encounter (HOSPITAL_BASED_OUTPATIENT_CLINIC_OR_DEPARTMENT_OTHER): Payer: Self-pay

## 2024-02-03 DIAGNOSIS — F902 Attention-deficit hyperactivity disorder, combined type: Secondary | ICD-10-CM | POA: Insufficient documentation

## 2024-02-24 DIAGNOSIS — M79645 Pain in left finger(s): Secondary | ICD-10-CM | POA: Diagnosis not present

## 2024-03-09 DIAGNOSIS — N6311 Unspecified lump in the right breast, upper outer quadrant: Secondary | ICD-10-CM | POA: Diagnosis not present

## 2024-03-09 DIAGNOSIS — N6452 Nipple discharge: Secondary | ICD-10-CM | POA: Diagnosis not present

## 2024-03-09 DIAGNOSIS — Z853 Personal history of malignant neoplasm of breast: Secondary | ICD-10-CM | POA: Diagnosis not present

## 2024-03-09 DIAGNOSIS — D241 Benign neoplasm of right breast: Secondary | ICD-10-CM | POA: Diagnosis not present

## 2024-04-02 ENCOUNTER — Other Ambulatory Visit: Payer: Self-pay

## 2024-04-02 MED ORDER — BACLOFEN 10 MG PO TABS
10.0000 mg | ORAL_TABLET | Freq: Two times a day (BID) | ORAL | 2 refills | Status: AC | PRN
  Filled 2024-04-02: qty 30, 15d supply, fill #0

## 2024-04-02 MED ORDER — TRAMADOL HCL 50 MG PO TABS
50.0000 mg | ORAL_TABLET | Freq: Four times a day (QID) | ORAL | 0 refills | Status: AC | PRN
  Filled 2024-04-02: qty 20, 5d supply, fill #0

## 2024-04-10 DIAGNOSIS — M545 Low back pain, unspecified: Secondary | ICD-10-CM | POA: Insufficient documentation

## 2024-04-10 DIAGNOSIS — M5459 Other low back pain: Secondary | ICD-10-CM | POA: Diagnosis not present

## 2024-04-13 ENCOUNTER — Encounter: Payer: Self-pay | Admitting: Advanced Practice Midwife

## 2024-04-23 ENCOUNTER — Ambulatory Visit (HOSPITAL_BASED_OUTPATIENT_CLINIC_OR_DEPARTMENT_OTHER): Admitting: Physician Assistant

## 2024-04-23 DIAGNOSIS — Z113 Encounter for screening for infections with a predominantly sexual mode of transmission: Secondary | ICD-10-CM | POA: Diagnosis not present

## 2024-04-23 DIAGNOSIS — F909 Attention-deficit hyperactivity disorder, unspecified type: Secondary | ICD-10-CM | POA: Insufficient documentation

## 2024-04-23 DIAGNOSIS — G4700 Insomnia, unspecified: Secondary | ICD-10-CM | POA: Insufficient documentation

## 2024-04-23 DIAGNOSIS — Z01419 Encounter for gynecological examination (general) (routine) without abnormal findings: Secondary | ICD-10-CM | POA: Diagnosis not present

## 2024-04-23 DIAGNOSIS — F419 Anxiety disorder, unspecified: Secondary | ICD-10-CM | POA: Insufficient documentation

## 2024-04-23 DIAGNOSIS — I10 Essential (primary) hypertension: Secondary | ICD-10-CM | POA: Insufficient documentation

## 2024-04-23 DIAGNOSIS — F331 Major depressive disorder, recurrent, moderate: Secondary | ICD-10-CM | POA: Insufficient documentation

## 2024-04-23 DIAGNOSIS — Z1331 Encounter for screening for depression: Secondary | ICD-10-CM | POA: Diagnosis not present

## 2024-04-23 DIAGNOSIS — N946 Dysmenorrhea, unspecified: Secondary | ICD-10-CM | POA: Diagnosis not present

## 2024-04-23 DIAGNOSIS — G43909 Migraine, unspecified, not intractable, without status migrainosus: Secondary | ICD-10-CM | POA: Insufficient documentation

## 2024-04-23 DIAGNOSIS — D229 Melanocytic nevi, unspecified: Secondary | ICD-10-CM | POA: Diagnosis not present

## 2024-04-23 DIAGNOSIS — Z01411 Encounter for gynecological examination (general) (routine) with abnormal findings: Secondary | ICD-10-CM | POA: Diagnosis not present

## 2024-04-23 DIAGNOSIS — H538 Other visual disturbances: Secondary | ICD-10-CM | POA: Diagnosis not present

## 2024-04-23 DIAGNOSIS — Z118 Encounter for screening for other infectious and parasitic diseases: Secondary | ICD-10-CM | POA: Diagnosis not present

## 2024-04-23 DIAGNOSIS — K581 Irritable bowel syndrome with constipation: Secondary | ICD-10-CM | POA: Insufficient documentation

## 2024-04-25 ENCOUNTER — Other Ambulatory Visit: Payer: Self-pay | Admitting: Ophthalmology

## 2024-04-25 DIAGNOSIS — R0989 Other specified symptoms and signs involving the circulatory and respiratory systems: Secondary | ICD-10-CM

## 2024-04-25 DIAGNOSIS — H9312 Tinnitus, left ear: Secondary | ICD-10-CM

## 2024-04-25 DIAGNOSIS — G43009 Migraine without aura, not intractable, without status migrainosus: Secondary | ICD-10-CM

## 2024-04-26 ENCOUNTER — Other Ambulatory Visit: Payer: Self-pay | Admitting: Ophthalmology

## 2024-04-26 DIAGNOSIS — G43009 Migraine without aura, not intractable, without status migrainosus: Secondary | ICD-10-CM

## 2024-04-26 DIAGNOSIS — H9312 Tinnitus, left ear: Secondary | ICD-10-CM

## 2024-04-26 DIAGNOSIS — R0989 Other specified symptoms and signs involving the circulatory and respiratory systems: Secondary | ICD-10-CM

## 2024-05-04 ENCOUNTER — Other Ambulatory Visit

## 2024-05-15 ENCOUNTER — Other Ambulatory Visit: Payer: Self-pay

## 2024-05-15 DIAGNOSIS — A09 Infectious gastroenteritis and colitis, unspecified: Secondary | ICD-10-CM | POA: Diagnosis not present

## 2024-05-15 MED ORDER — LOPERAMIDE HCL 2 MG PO TABS
ORAL_TABLET | ORAL | 5 refills | Status: AC
  Filled 2024-05-15: qty 24, 4d supply, fill #0

## 2024-05-15 MED ORDER — CIPROFLOXACIN HCL 500 MG PO TABS
500.0000 mg | ORAL_TABLET | Freq: Two times a day (BID) | ORAL | 0 refills | Status: AC
  Filled 2024-05-15: qty 10, 5d supply, fill #0

## 2024-05-16 ENCOUNTER — Ambulatory Visit
Admission: RE | Admit: 2024-05-16 | Discharge: 2024-05-16 | Disposition: A | Discharge status: Home / Self Care | Source: Ambulatory Visit | Attending: Ophthalmology | Admitting: Ophthalmology

## 2024-05-16 DIAGNOSIS — G43009 Migraine without aura, not intractable, without status migrainosus: Secondary | ICD-10-CM

## 2024-05-16 DIAGNOSIS — R0989 Other specified symptoms and signs involving the circulatory and respiratory systems: Secondary | ICD-10-CM

## 2024-05-16 DIAGNOSIS — H9312 Tinnitus, left ear: Secondary | ICD-10-CM

## 2024-05-16 MED ORDER — GADOPICLENOL 0.5 MMOL/ML IV SOLN
8.0000 mL | Freq: Once | INTRAVENOUS | Status: AC | PRN
  Administered 2024-05-16: 8 mL via INTRAVENOUS

## 2024-05-22 ENCOUNTER — Telehealth: Payer: Self-pay

## 2024-05-22 NOTE — Telephone Encounter (Signed)
 Patient called [facility] [place]. Front Information systems manager Ritta reached out to me to see if she could be seen by Dr. Janjua as a second opinion.

## 2024-05-25 ENCOUNTER — Ambulatory Visit (INDEPENDENT_AMBULATORY_CARE_PROVIDER_SITE_OTHER): Admitting: Neurosurgery

## 2024-05-25 ENCOUNTER — Ambulatory Visit: Admitting: Neurosurgery

## 2024-05-25 ENCOUNTER — Encounter: Payer: Self-pay | Admitting: Neurosurgery

## 2024-05-25 VITALS — BP 126/88 | HR 77 | Ht 62.0 in | Wt 161.0 lb

## 2024-05-25 DIAGNOSIS — R519 Headache, unspecified: Secondary | ICD-10-CM

## 2024-05-25 DIAGNOSIS — R51 Headache with orthostatic component, not elsewhere classified: Secondary | ICD-10-CM

## 2024-05-25 NOTE — Progress Notes (Signed)
 Assessment : 23 year old formerly healthy lady who has been having headaches.  Patient went to see her doctor and propranolol was started.  From this, her headaches got better but she is still having the ringing in her ears that alternate between the ears.  This is ringing rather than pulsatile tinnitus.  She feels that when she gets up from a seated position or from a lying to a standing position she gets pressure in her head and that lasts about 10 seconds.  After that this gets better.  Patient went and saw an eye doctor and he recommended an lumbar puncture MRV and an MRI.  She did not feel that she wanted to do all the studies but did do an MRV.  Eye exam that she did did not show any papilledema.  Next  Patient sought medical attention from a physician for this and sought me out for my opinion.  Plan : I think that there are 2 components at play here: Firstly, it is possible that she has orthostatic hypotension because she was started on propranolol 60 mg extended release which she takes at night.  I also think that she grinds her teeth at night which can result in headaches in the morning.  We went over this and she says that she has not taken propranolol in a while but is still having headaches when she wakes up in the morning, which happens when she stands up.  I told her that it could be a blood pressure issue as well as typically somebody with idiopathic intracranial hypertension would not get this.  I recommended that she drinks more caffeine and drinks more water.  If it is idiopathic intracranial hypertension, then I would recommend starting on a low-dose Diamox to see how this goes.  We decided to see to the back in 2 weeks and then we will see how she does.   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   Social Drivers of Corporate investment banker Strain: Low Risk  (02/01/2024)   Received from [facility]   Overall Financial Resource Strain (CARDIA)    Difficulty of Paying Living Expenses: Not hard at all  Food Insecurity: No Food Insecurity (02/01/2024)   [facility] from [facility]   Hunger Vital Sign    Within the past 12 months, you worried that your food would run out before you got the money to buy more.: Never true    Within the past 12 months, the food you bought just didn't last and you didn't have money to get more.: Never true  Transportation Needs: No Transportation Needs (02/01/2024)   Received from [facility]  PRAPARE - Administrator, Civil Service (Medical): No    Lack of Transportation (Non-Medical): No  Physical Activity: Insufficiently Active (02/01/2024)   Received from [facility]   Exercise Vital Sign    On average, how many days per week do you engage in moderate to strenuous exercise (like a brisk walk)?: 3 days    On average, how many minutes do you engage in exercise at this level?: 40 min  Stress: Stress Concern Present (02/01/2024)   Received from [facility] - Occupational Stress Questionnaire    Feeling of Stress : Rather much  Social Connections: Somewhat Isolated (02/01/2024)   Received from [facility]   Social Network    How would you rate your social network (family, work, friends)?:  Restricted participation with some degree of social isolation  Intimate Partner Violence: Not At Risk (02/01/2024)   Received from [facility]   HITS    Over the last 12 months how often did your partner physically hurt you?: Never    Over the last 12 months how often did your partner insult you or talk down to you?: Never    Over the last 12 months how often did your partner threaten you with physical harm?: Never    Over the last 12 months how often did your partner scream or curse at you?: Never    Family History  Problem Relation Age of Onset   Hypertension Mother    Migraines Mother     Allergies  Allergen Reactions   Penicillins Nausea Only    Doesn't work for her    Past Medical History:  Diagnosis Date   ADHD (attention deficit hyperactivity disorder)    Asthma    Constipation    Dysplastic nevus 12/14/2022   Leftr breast - moderate   Dysplastic nevus 12/14/2022   Gluteal crease - moderate   Irregular bleeding 02/13/2015   Migraine     Past Surgical History:  Procedure Laterality Date   FINGER FRACTURE SURGERY Left 06/03/2023   WISDOM TOOTH EXTRACTION       Physical Exam HENT:     Head: Normocephalic.     Nose: Nose normal.  Eyes:     Pupils: Pupils are equal, round, and reactive to light.  Cardiovascular:     Rate and Rhythm: Normal rate.  Pulmonary:     Effort: Pulmonary effort is normal.  Abdominal:     General: Abdomen is flat.  Musculoskeletal:     Cervical back: Normal range of motion.  Neurological:     Mental Status: She is alert.     Cranial Nerves: Cranial nerves 2-12 are intact.     Sensory: Sensation is intact.     Motor: Motor function is intact.     Coordination: Coordination is intact.        No results found for this or any previous visit.

## 2024-06-05 ENCOUNTER — Encounter: Payer: Self-pay | Admitting: Neurosurgery

## 2024-06-05 ENCOUNTER — Ambulatory Visit (INDEPENDENT_AMBULATORY_CARE_PROVIDER_SITE_OTHER): Admitting: Neurosurgery

## 2024-06-05 VITALS — BP 124/79 | HR 78 | Ht 62.0 in | Wt 163.0 lb

## 2024-06-05 DIAGNOSIS — R51 Headache with orthostatic component, not elsewhere classified: Secondary | ICD-10-CM

## 2024-06-05 DIAGNOSIS — T450X5S Adverse effect of antiallergic and antiemetic drugs, sequela: Secondary | ICD-10-CM | POA: Diagnosis not present

## 2024-06-05 DIAGNOSIS — R519 Headache, unspecified: Secondary | ICD-10-CM | POA: Diagnosis not present

## 2024-06-05 DIAGNOSIS — R21 Rash and other nonspecific skin eruption: Secondary | ICD-10-CM | POA: Diagnosis not present

## 2024-06-05 NOTE — Progress Notes (Signed)
 23 year old lady with headaches who will be suspected of having an orthostatic component with as well as possibly an elevated intracranial pressure component.  We decided to start with some caffeinated beverages and see how she did, She has done this and noticed no difference whatsoever.  She is also drinking more water and has not noticed any change.  We talked about empirically trying some Diamox to see if this improves her headaches.  I told her that the benefit of this would be is that she would not have any discomfort from an intervention and it would be empiric treatment but the drawback would be that we would not know for sure whether she has elevated intracranial pressures and whether we are treating the right condition.  Furthermore, Diamox can also have side effects.  Alternatively, we could do a lumbar puncture which has the drawback of it being an intervention and can also result in both spinal headaches requiring a blood patch but, we would have knowledge for sure whether or not she has elevated intracranial pressures.  We talked about this in detail and she decided that she is good to go home and think about it and let us  know which one of the 2 she wants to do.  I plan on seeing her back in a month.

## 2024-07-05 ENCOUNTER — Inpatient Hospital Stay
Admission: RE | Admit: 2024-07-05 | Discharge: 2024-07-05 | Disposition: A | Payer: Self-pay | Source: Ambulatory Visit | Attending: Neuroradiology | Admitting: Neuroradiology

## 2024-07-05 ENCOUNTER — Other Ambulatory Visit: Payer: Self-pay

## 2024-07-05 ENCOUNTER — Inpatient Hospital Stay
Admission: RE | Admit: 2024-07-05 | Discharge: 2024-07-05 | Disposition: A | Discharge status: Home / Self Care | Payer: Self-pay | Source: Ambulatory Visit | Attending: Neurosurgery | Admitting: Neurosurgery

## 2024-07-05 DIAGNOSIS — R51 Headache with orthostatic component, not elsewhere classified: Secondary | ICD-10-CM

## 2024-07-05 DIAGNOSIS — Z049 Encounter for examination and observation for unspecified reason: Secondary | ICD-10-CM

## 2024-07-06 ENCOUNTER — Telehealth: Payer: Self-pay | Admitting: Neurosurgery

## 2024-07-06 ENCOUNTER — Ambulatory Visit: Admitting: Neurosurgery

## 2024-07-06 NOTE — Telephone Encounter (Signed)
 Pt wanted to reschedule appt, Left VM to call us  back and reschedule

## 2024-07-24 DIAGNOSIS — K5900 Constipation, unspecified: Secondary | ICD-10-CM | POA: Insufficient documentation

## 2024-07-24 DIAGNOSIS — R1012 Left upper quadrant pain: Secondary | ICD-10-CM | POA: Insufficient documentation

## 2024-07-27 ENCOUNTER — Ambulatory Visit: Admitting: Neurosurgery

## 2024-07-30 ENCOUNTER — Encounter: Payer: Self-pay | Admitting: Radiology

## 2024-07-31 ENCOUNTER — Ambulatory Visit: Admitting: Physician Assistant

## 2024-07-31 ENCOUNTER — Encounter: Payer: Self-pay | Admitting: Physician Assistant

## 2024-07-31 DIAGNOSIS — D489 Neoplasm of uncertain behavior, unspecified: Secondary | ICD-10-CM

## 2024-07-31 DIAGNOSIS — L821 Other seborrheic keratosis: Secondary | ICD-10-CM

## 2024-07-31 DIAGNOSIS — W908XXA Exposure to other nonionizing radiation, initial encounter: Secondary | ICD-10-CM | POA: Diagnosis not present

## 2024-07-31 DIAGNOSIS — D485 Neoplasm of uncertain behavior of skin: Secondary | ICD-10-CM | POA: Diagnosis not present

## 2024-07-31 DIAGNOSIS — L814 Other melanin hyperpigmentation: Secondary | ICD-10-CM | POA: Diagnosis not present

## 2024-07-31 DIAGNOSIS — L578 Other skin changes due to chronic exposure to nonionizing radiation: Secondary | ICD-10-CM

## 2024-07-31 DIAGNOSIS — D1801 Hemangioma of skin and subcutaneous tissue: Secondary | ICD-10-CM

## 2024-07-31 DIAGNOSIS — Z1283 Encounter for screening for malignant neoplasm of skin: Secondary | ICD-10-CM

## 2024-07-31 DIAGNOSIS — Z86018 Personal history of other benign neoplasm: Secondary | ICD-10-CM | POA: Diagnosis not present

## 2024-07-31 DIAGNOSIS — D229 Melanocytic nevi, unspecified: Secondary | ICD-10-CM

## 2024-07-31 NOTE — Patient Instructions (Signed)

## 2024-07-31 NOTE — Progress Notes (Signed)
   Follow-Up Visit   Subjective  Tonya Mclaughlin is a 23 y.o. female ESTABLISHED PATIENT who presents for the following: Skin Cancer Screening and Full Body Skin Exam - History of dysplastic nevi. No history of skin cancer, no family history of melanoma.  The patient presents for Total-Body Skin Exam (TBSE) for skin cancer screening and mole check. The patient has spots, moles and lesions to be evaluated, some may be new or changing and the patient may have concern these could be cancer.  New mole on bottom of left foot.     The following portions of the chart were reviewed this encounter and updated as appropriate: medications, allergies, medical history  Review of Systems:  No other skin or systemic complaints except as noted in HPI or Assessment and Plan.  Objective  Well appearing patient in no apparent distress; mood and affect are within normal limits.  A full examination was performed including scalp, head, eyes, ears, nose, lips, neck, chest, axillae, abdomen, back, buttocks, bilateral upper extremities, bilateral lower extremities, hands, feet, fingers, toes, fingernails, and toenails. All findings within normal limits unless otherwise noted below.   Relevant physical exam findings are noted in the Assessment and Plan.  Left Middle Plantar Surface Slightly irregular brown macule 2mm   Assessment & Plan   SKIN CANCER SCREENING PERFORMED TODAY.  ACTINIC DAMAGE - Chronic condition, secondary to cumulative UV/sun exposure - diffuse scaly erythematous macules with underlying dyspigmentation - Recommend daily broad spectrum sunscreen SPF 30+ to sun-exposed areas, reapply every 2 hours as needed.  - Staying in the shade or wearing long sleeves, sun glasses (UVA+UVB protection) and wide brim hats (4-inch brim around the entire circumference of the hat) are also recommended for sun protection.  - Call for new or changing lesions.  LENTIGINES, SEBORRHEIC KERATOSES, HEMANGIOMAS -  Benign normal skin lesions - Benign-appearing - Call for any changes  MELANOCYTIC NEVI - Tan-brown and/or pink-flesh-colored symmetric macules and papules - Benign appearing on exam today - Observation - Call clinic for new or changing moles - Recommend daily use of broad spectrum spf 30+ sunscreen to sun-exposed areas.   History of Dysplastic Nevi - No evidence of recurrence today - Recommend regular full body skin exams - Recommend daily broad spectrum sunscreen SPF 30+ to sun-exposed areas, reapply every 2 hours as needed.  - Call if any new or changing lesions are noted between office visits    NEOPLASM OF UNCERTAIN BEHAVIOR OF SKIN Left Middle Plantar Surface Will plan shave removal on follow up. Patient declined shave removal today.  SCREENING EXAM FOR SKIN CANCER   MULTIPLE BENIGN NEVI   LENTIGINES   CHERRY ANGIOMA   ACTINIC SKIN DAMAGE   SKIN CANCER SCREENING   NEOPLASM OF UNCERTAIN BEHAVIOR   HISTORY OF DYSPLASTIC NEVUS   Return in about 3 months (around 10/31/2024) for recheck nevus of left mid plantar surface, 1 year for FBSE.  I, Roseline Hutchinson, CMA, am acting as scribe for Ruairi Stutsman K, PA-C .   Documentation: I have reviewed the above documentation for accuracy and completeness, and I agree with the above.  Joshue Badal K, PA-C

## 2024-08-06 ENCOUNTER — Ambulatory Visit: Admitting: Physician Assistant

## 2024-08-20 DIAGNOSIS — N939 Abnormal uterine and vaginal bleeding, unspecified: Secondary | ICD-10-CM | POA: Diagnosis not present

## 2024-08-20 DIAGNOSIS — Z3009 Encounter for other general counseling and advice on contraception: Secondary | ICD-10-CM | POA: Diagnosis not present

## 2024-08-20 DIAGNOSIS — Z3202 Encounter for pregnancy test, result negative: Secondary | ICD-10-CM | POA: Diagnosis not present

## 2024-10-17 ENCOUNTER — Other Ambulatory Visit (HOSPITAL_BASED_OUTPATIENT_CLINIC_OR_DEPARTMENT_OTHER): Payer: Self-pay

## 2024-10-17 MED ORDER — PROPRANOLOL HCL ER 60 MG PO CP24
60.0000 mg | ORAL_CAPSULE | Freq: Every day | ORAL | 3 refills | Status: AC
  Filled 2024-10-17: qty 30, 30d supply, fill #0

## 2024-10-17 MED ORDER — SLYND 4 MG PO TABS
4.0000 mg | ORAL_TABLET | Freq: Every day | ORAL | 3 refills | Status: AC
  Filled 2024-10-17: qty 28, 28d supply, fill #0

## 2024-11-07 ENCOUNTER — Ambulatory Visit: Admitting: Physician Assistant
